# Patient Record
Sex: Female | Born: 1965
Health system: Southern US, Community
[De-identification: ages and names within clinical notes are randomized; demographics above are authoritative.]

## PROBLEM LIST (undated history)

## (undated) DIAGNOSIS — D259 Leiomyoma of uterus, unspecified: Secondary | ICD-10-CM

## (undated) DIAGNOSIS — K601 Chronic anal fissure: Secondary | ICD-10-CM

## (undated) HISTORY — DX: Chronic anal fissure: K60.1

---

## 1992-04-18 HISTORY — PX: TUBAL LIGATION: SHX77

## 1999-03-05 ENCOUNTER — Other Ambulatory Visit: Admission: RE | Admit: 1999-03-05 | Discharge: 1999-03-05 | Payer: Self-pay | Admitting: Obstetrics & Gynecology

## 2000-11-07 ENCOUNTER — Ambulatory Visit (HOSPITAL_COMMUNITY): Admission: RE | Admit: 2000-11-07 | Discharge: 2000-11-07 | Payer: Self-pay | Admitting: General Surgery

## 2000-11-07 ENCOUNTER — Encounter (INDEPENDENT_AMBULATORY_CARE_PROVIDER_SITE_OTHER): Payer: Self-pay

## 2000-11-07 HISTORY — PX: HEMORROIDECTOMY: SUR656

## 2001-02-13 ENCOUNTER — Other Ambulatory Visit: Admission: RE | Admit: 2001-02-13 | Discharge: 2001-02-13 | Payer: Self-pay | Admitting: Obstetrics and Gynecology

## 2001-06-14 ENCOUNTER — Other Ambulatory Visit: Admission: RE | Admit: 2001-06-14 | Discharge: 2001-06-14 | Payer: Self-pay | Admitting: Obstetrics and Gynecology

## 2002-02-14 ENCOUNTER — Other Ambulatory Visit: Admission: RE | Admit: 2002-02-14 | Discharge: 2002-02-14 | Payer: Self-pay | Admitting: Obstetrics and Gynecology

## 2006-11-14 ENCOUNTER — Ambulatory Visit (HOSPITAL_COMMUNITY): Admission: RE | Admit: 2006-11-14 | Discharge: 2006-11-14 | Payer: Self-pay | Admitting: *Deleted

## 2006-11-14 ENCOUNTER — Encounter (INDEPENDENT_AMBULATORY_CARE_PROVIDER_SITE_OTHER): Payer: Self-pay | Admitting: *Deleted

## 2006-11-14 HISTORY — PX: STAPLE HEMORRHOIDECTOMY: SHX2438

## 2009-10-26 ENCOUNTER — Encounter: Admission: RE | Admit: 2009-10-26 | Discharge: 2009-10-26 | Payer: Self-pay | Admitting: Obstetrics and Gynecology

## 2010-04-18 HISTORY — PX: BAND HEMORRHOIDECTOMY: SHX1213

## 2010-05-01 ENCOUNTER — Ambulatory Visit (HOSPITAL_COMMUNITY): Admission: EM | Admit: 2010-05-01 | Discharge: 2010-05-02 | Payer: Self-pay | Source: Home / Self Care

## 2010-05-01 ENCOUNTER — Encounter (INDEPENDENT_AMBULATORY_CARE_PROVIDER_SITE_OTHER): Payer: Self-pay | Admitting: General Surgery

## 2010-05-01 HISTORY — PX: EXCISIONAL HEMORRHOIDECTOMY: SHX1541

## 2010-05-03 LAB — DIFFERENTIAL
Basophils Absolute: 0 10*3/uL (ref 0.0–0.1)
Basophils Relative: 0 % (ref 0–1)
Eosinophils Absolute: 0.1 10*3/uL (ref 0.0–0.7)
Eosinophils Relative: 1 % (ref 0–5)
Lymphocytes Relative: 27 % (ref 12–46)
Lymphs Abs: 1.4 10*3/uL (ref 0.7–4.0)
Monocytes Absolute: 0.4 10*3/uL (ref 0.1–1.0)
Monocytes Relative: 8 % (ref 3–12)
Neutro Abs: 3.4 10*3/uL (ref 1.7–7.7)
Neutrophils Relative %: 64 % (ref 43–77)

## 2010-05-03 LAB — CBC
HCT: 41 % (ref 36.0–46.0)
Hemoglobin: 13.8 g/dL (ref 12.0–15.0)
MCH: 30.8 pg (ref 26.0–34.0)
MCHC: 33.7 g/dL (ref 30.0–36.0)
MCV: 91.5 fL (ref 78.0–100.0)
Platelets: 287 10*3/uL (ref 150–400)
RBC: 4.48 MIL/uL (ref 3.87–5.11)
RDW: 12.2 % (ref 11.5–15.5)
WBC: 5.3 10*3/uL (ref 4.0–10.5)

## 2010-05-03 LAB — BASIC METABOLIC PANEL
BUN: 7 mg/dL (ref 6–23)
CO2: 27 mEq/L (ref 19–32)
Calcium: 9.7 mg/dL (ref 8.4–10.5)
Chloride: 104 mEq/L (ref 96–112)
Creatinine, Ser: 0.82 mg/dL (ref 0.4–1.2)
GFR calc Af Amer: >60 mL/min (ref >60)
GFR calc non Af Amer: >60 mL/min (ref >60)
Glucose, Bld: 88 mg/dL (ref 70–99)
Potassium: 3.9 mEq/L (ref 3.5–5.1)
Sodium: 141 mEq/L (ref 135–145)

## 2010-05-07 NOTE — H&P (Signed)
  NAMESITLALY, GUDIEL                ACCOUNT NO.:  [id number]  MEDICAL RECORD NO.:  [id number]          PATIENT TYPE:  EMS  LOCATION:  ED                           FACILITY:  [facility]  PHYSICIAN:  Lorne Skeens. Hoxworth, M.D.DATE OF BIRTH:  05/05/1965  DATE OF ADMISSION:  05/01/2010 DATE OF DISCHARGE:                             HISTORY & PHYSICAL   CHIEF COMPLAINT:  Severe hemorrhoidal pain.  HISTORY OF PRESENT ILLNESS:  Misty Nunez is a 45 year old female with a long history of hemorrhoidal disease which developed during pregnancy years ago.  She has undergone an open hemorrhoidectomy by Dr. Kendrick Ranch about 10 to 12 years ago.  She had recurrent symptoms and underwent a PPH by Dr. Colin Benton in 2005.  She subsequently developed recurrent symptoms with discomfort, swelling and pressure.  She has undergone two previous banding procedures in recent months at Dr. Jennye Boroughs office without difficulty.  She had a third banding procedure done 3 days ago.  She experienced some increased pain soon after the procedure.  This has steadily worsened and over the last 24 hours has become very severe and intolerable with a large area of external swelling.  She states that the rubber-band and hemorrhoid extruded and she did remove the rubber-band but this did not produce any relief.  She presents to the [facility] Emergency Room.  PAST MEDICAL HISTORY:  She has otherwise been in excellent health without any other previous medical or surgical problems.  MEDICATIONS:  Only Percocet with this illness.  ALLERGIES:  FLAGYL.  SOCIAL HISTORY:  Married.  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  She is married to one of our surgical technicians.  She does not smoke cigarettes or drink alcohol.  FAMILY HISTORY:  Noncontributory.  REVIEW OF SYSTEMS:  GENERAL:  No fever or chills.  RESPIRATORY:  No shortness of breath, cough, or wheezing.  CARDIAC:  No chest pain, palpitations, history of heart disease.   ABDOMEN/GASTROINTESTINAL:  As above with no abdominal pain, nausea, or vomiting.  PHYSICAL EXAMINATION:  VITAL SIGNS:  She is afebrile, heart rate 90, blood pressure 119/71, respirations 16, O2 saturations 100%. GENERAL:  A well-developed, generally healthy-appearing African-American female who is in obvious pain. HEENT:  Sclerae nonicteric.  Oropharynx clear. LUNGS:  Clear without wheezing or increased work of breathing. CARDIAC:  Regular rate and rhythm without murmurs. ABDOMEN:  Soft and nontender. RECTAL:  There is a large, several-centimeter, grade 4, thrombosed and necrotic combination hemorrhoid, exquisitely tender.  ASSESSMENT AND PLAN:  Thrombosed necrotic combination hemorrhoid with severe pain.  The patient will require urgent hemorrhoidectomy under general anesthesia and is admitted for this procedure.     Lorne Skeens. Hoxworth, M.D.     [name]  D:  05/01/2010  T:  05/01/2010  Job:  [id number]  Electronically Signed by Glenna Fellows M.D. on 05/07/2010 09:15:30 AM

## 2010-05-17 NOTE — Discharge Summary (Signed)
  Misty Nunez, Misty Nunez                ACCOUNT NO.:  [id number]  MEDICAL RECORD NO.:  [id number]          PATIENT TYPE:  INP  LOCATION:  1530                         FACILITY:  [facility]  PHYSICIAN:  Sharlet Salina T. Hoxworth, M.D.DATE OF BIRTH:  1965/08/08  DATE OF ADMISSION:  05/01/2010 DATE OF DISCHARGE:  05/02/2010                              DISCHARGE SUMMARY   DISCHARGE DIAGNOSIS:  Thrombosed hemorrhoid.  OPERATIONS AND PROCEDURES:  Hemorrhoidectomy May 01, 2010 by Dr. Gaynelle Adu.  HISTORY OF PRESENT ILLNESS:  The patient is a 45 year old female with a previous history of open hemorrhoidectomy and PTH in years past.  She underwent banding at Dr. Jennye Boroughs office for internal hemorrhoid 3 days ago.  She presents the emergency room with increasing severe pain and swelling.  She has had prolapse of a hemorrhoid.  She removed the rubber- band herself but has not had relief.  PAST MEDICAL HISTORY:  Otherwise negative.  MEDICATIONS:  Percocet.  ALLERGIES:  FLAGYL.  PERTINENT PHYSICAL EXAMINATION:  A well-developed female.  Vital Signs: Within normal limits in obvious pain.  Rectal exam shows a large 3-4 cm grade 4 thrombosed and necrotic combination external hemorrhoid.  HOSPITAL COURSE:  The patient was admitted and underwent emergency hemorrhoidectomy by Dr. Andrey Campanile, which she tolerated well.  She was feeling much better the following day.  She is discharged home at this time.  Discharge medications are same as admission and lidocaine cream. Will follow up with Dr. Andrey Campanile in the office in 1 week.     Lorne Skeens. Hoxworth, M.D.     [name]  D:  05/11/2010  T:  05/11/2010  Job:  [id number]  cc:   Griffith Citron, M.D. Fax: [phone]  Electronically Signed by Glenna Fellows M.D. on 05/17/2010 02:02:00 PM

## 2010-08-31 NOTE — Op Note (Signed)
NAME:  Misty Nunez, Misty Nunez                ACCOUNT NO.:  [id number]   MEDICAL RECORD NO.:  [id number]          PATIENT TYPE:  AMB   LOCATION:  DAY                          FACILITY:  [facility]   PHYSICIAN:  Alfonse Ras, MD   DATE OF BIRTH:  28-Nov-1965   DATE OF PROCEDURE:  11/14/2006  DATE OF DISCHARGE:                               OPERATIVE REPORT   PREOPERATIVE DIAGNOSIS:  Recurrent grade 2 internal hemorrhoids with  bleeding and prolapse.   PROCEDURE:  Procedure for prolapsing hemorrhoids, stapled rectopexy.   SURGEON:  Baruch Merl, M.D.   ANESTHESIA:  General.   DESCRIPTION:  The patient was taken to the operating room placed in a  supine position after adequate general anesthesia was induced using  endotracheal tube.  Perianal and rectal prep were undertaken.  Anal  dilatation was accomplished to three fingerbreadths.  Hemorrhoidal  bundles were injected with 0.25 Marcaine with Wydase.  Internal and  external sphincter muscles were injected with an additional 30 mL of  0.25 Marcaine.  A 2-0 Prolene pursestring suture was placed in the  mucosa and submucosa 5 cm proximal to the dentate line.  The stapler was  then placed proximal to a pursestring and the pursestring was tied down  around the anvil.  This was held in place after it was closed for 45  seconds.  The vagina was checked and there was no evidence of septal  involvement.  Stapler was then fired and removed.  There was a good rim  about 2 cm of hemorrhoidal tissue.  The staple line was then inspected  and there was no evidence of bleeding.  Gelfoam packing was placed.  The  patient tolerated the procedure well went to PACU in good condition.      Alfonse Ras, MD  Electronically Signed     KRE/MEDQ  D:  11/14/2006  T:  11/15/2006  Job:  [id number]

## 2010-09-03 NOTE — Op Note (Signed)
[facility]  Patient:    Misty Nunez, Misty Nunez                         MRN: [id number] Proc. Date: 11/07/00 Attending:  Sheppard Plumber. Earlene Plater, M.D.                           Operative Report  PREOPERATIVE DIAGNOSIS:  Internal and external hemorrhoids.  POSTOPERATIVE DIAGNOSIS:  Internal and external hemorrhoids.  PROCEDURE:  Hemorrhoidectomy and proctoscopy.  SURGEON:  Timothy E. Earlene Plater, M.D.  ANESTHESIA:  General.  INDICATIONS:  [name] is otherwise quite healthy, 35.  She has had hemorrhoids for years, particularly during her pregnancies and multiple deliveries.  She has finally come for surgical consultation.  Rather massive internal and external hemorrhoids are noted, including external anodermal and internal and external complexes.  Her sphincters are intact.  There is no other pathology.  She does have a large uterus with fibroids.  She has been carefully counseled, as well her husband.  DESCRIPTION OF PROCEDURE:  The patient was brought to the operating room and placed supine.  LMA anesthesia provided.  She was placed in lithotomy position, perianal area inspected, prepped and draped in the usual fashion. Proctoscopy carried out to 18 cm and was normal.  The scope withdrawn and area reprepped and draped.  Massive hemorrhoids were noted in the left lateral and right posterior positions.  Additional auxiliary hemorrhoids were present as external and anodermal only in the posterior, left anterior and right anterior positions.  The area was injected around and about with Marcaine 0.5% with epinephrine mixed 9:1 with Wydase.  This was massaged in well.  The anatomy was restored.  Then, with the operating anoscope in place, the two major hemorrhoids were completely excised with a careful skinny ellipse, undermining the edges and closure with 2-0 chromic.  The three external anodermal hemorrhoids were removed as skinny ellipsis as well, undermining the  edges, and then closure with 3-0 chromic.  This completed the procedure.  There was no other pathology.  There was no bleeding or complications.  She tolerated it well.  Counts were correct.  Gelfoam and dry sterile dressing applied.  She was moved to the recovery room in good condition.  Written and verbal instructions including Percocet #36 were given her and her husband, and she will be seen and followed as an outpatient. DD:  11/07/00 TD:  11/07/00 Job: [id number]

## 2010-09-17 ENCOUNTER — Encounter (INDEPENDENT_AMBULATORY_CARE_PROVIDER_SITE_OTHER): Payer: Self-pay | Admitting: General Surgery

## 2010-11-02 ENCOUNTER — Other Ambulatory Visit: Payer: Self-pay | Admitting: Obstetrics and Gynecology

## 2010-11-02 DIAGNOSIS — Z1231 Encounter for screening mammogram for malignant neoplasm of breast: Secondary | ICD-10-CM

## 2010-11-12 ENCOUNTER — Ambulatory Visit
Admission: RE | Admit: 2010-11-12 | Discharge: 2010-11-12 | Disposition: A | Discharge status: Home / Self Care | Payer: 59 | Source: Ambulatory Visit | Attending: Obstetrics and Gynecology | Admitting: Obstetrics and Gynecology

## 2010-11-12 DIAGNOSIS — Z1231 Encounter for screening mammogram for malignant neoplasm of breast: Secondary | ICD-10-CM

## 2011-01-31 LAB — HEMOGLOBIN AND HEMATOCRIT, BLOOD
HCT: 38.6
Hemoglobin: 13.1

## 2011-01-31 LAB — PREGNANCY, URINE: Preg Test, Ur: NEGATIVE

## 2011-02-16 ENCOUNTER — Ambulatory Visit (INDEPENDENT_AMBULATORY_CARE_PROVIDER_SITE_OTHER): Payer: Self-pay | Admitting: General Surgery

## 2011-02-17 ENCOUNTER — Ambulatory Visit (INDEPENDENT_AMBULATORY_CARE_PROVIDER_SITE_OTHER): Payer: Self-pay | Admitting: General Surgery

## 2011-02-22 ENCOUNTER — Other Ambulatory Visit (INDEPENDENT_AMBULATORY_CARE_PROVIDER_SITE_OTHER): Payer: Self-pay | Admitting: General Surgery

## 2011-02-22 ENCOUNTER — Ambulatory Visit (INDEPENDENT_AMBULATORY_CARE_PROVIDER_SITE_OTHER): Payer: 59 | Admitting: General Surgery

## 2011-02-22 ENCOUNTER — Encounter (INDEPENDENT_AMBULATORY_CARE_PROVIDER_SITE_OTHER): Payer: Self-pay | Admitting: General Surgery

## 2011-02-22 VITALS — BP 104/78 | HR 92 | Temp 98.2°F | Resp 12 | Ht 67.0 in | Wt 144.4 lb

## 2011-02-22 DIAGNOSIS — K602 Anal fissure, unspecified: Secondary | ICD-10-CM | POA: Insufficient documentation

## 2011-02-22 NOTE — Patient Instructions (Signed)
Call our office at [phone] when you would have looked at your work schedule.  Please call at least 4 weeks (preferably 6 weeks) before your desired operative week.

## 2011-02-22 NOTE — Progress Notes (Signed)
Chief Complaint  Patient presents with  . Routine Post Op    hemorrhoidectomy     HISTORY:  45 year old female comes in for long-term followup. I last saw her in September 30, 2010. I initially met her in the hospital on January 14 due to an urgent medical problem. She has had long-standing problems with hemorrhoids. Most recently she had undergone hemorrhoidal banding by Dr. Kinnie Scales in early January. This was her third banding. Unfortunately after that banding she developed acute worsening of her rectal pain and developed a necrotic prolapsed hemorrhoid. We took her urgently to the operating room that day and found an anal fissure as well as a grade 4 internal-external necrotic right posterior lateral hemorrhoid. She underwent an open excision of the right posterior lateral mixed column hemorrhoid.  Her hemorrhoids have improved. However her fissure persists. She still has pain that lasts for several hours after having a bowel movement. It is not as bad as it initially was. It is about a 5/10. She describes it as a tenderness. She denies any fevers or chills. She denies any diarrhea or constipation. She denies any incontinence. She denies any abdominal cramping, melena, or hematochezia. She denies any weight loss.  Past Medical History  Diagnosis Date  . Hemorrhoids   . Chronic anal fissure      Past Surgical History  Procedure Date  . Hemorroidectomy 11/07/00    left lateral & Rt posterior  . Tubal ligation 1994  . Staple hemorrhoidectomy 11/14/2006    PPH  . Band hemorrhoidectomy 04/2010    Dr Kinnie Scales x 3  . Excisional hemorrhoidectomy 05/01/10    Rt posterolateral (Grade 4 necrotic)    Current Outpatient Prescriptions  Medication Sig Dispense Refill  . HYDROcodone-acetaminophen (VICODIN ES) 7.5-750 MG per tablet Take 1 tablet by mouth every 6 (six) hours as needed.        Marland Kitchen ibuprofen (ADVIL,MOTRIN) 200 MG tablet Take 200 mg by mouth every 6 (six) hours as needed.        . nitroGLYCERIN  (NITROGLYN) 2 % ointment Place 0.5 inches onto the skin 3 (three) times daily.        Marland Kitchen oxyCODONE-acetaminophen (PERCOCET) 5-325 MG per tablet Take 1 tablet by mouth every 4 (four) hours as needed.        . vitamin B-12 (CYANOCOBALAMIN) 1000 MCG tablet Take 1,000 mcg by mouth daily.           Allergies  Allergen Reactions  . Flagyl (Metronidazole) Hives     Family History  Problem Relation Age of Onset  . Stroke Father      History   Social History  . Marital Status: Married    Spouse Name: N/A    Number of Children: N/A  . Years of Education: N/A   Social History Main Topics  . Smoking status: Never Smoker   . Smokeless tobacco: None  . Alcohol Use: Yes  . Drug Use: No  . Sexually Active:    Other Topics Concern  . None   Social History Narrative  . None     REVIEW OF SYSTEMS - PERTINENT POSITIVES ONLY: A mild cough without any sputum production. Otherwise a comprehensive review of systems is negative except for what is mentioned in the history of present illness   EXAM: Filed Vitals:   02/22/11 1452  BP: 104/78  Pulse: 92  Temp: 98.2 F (36.8 C)  Resp: 12    HEENT: normocephalic; pupils equal and reactive; sclerae clear; dentition  good; mucous membranes moist NECK:  ; symmetric on extension; no palpable anterior or posterior cervical lymphadenopathy; no supraclavicular masses; no tenderness CHEST: clear to auscultation bilaterally without rales, rhonchi, or wheezes CARDIAC: regular rate and rhythm without significant murmur; peripheral pulses are full ABDOMEN: Soft, NT, ND Rectal:  Small nonthrombosed, redundant ext hemorrhoid tissue in L lat position; chronic sentinel pile with fissure in post midline; DRE & anoscopy deferred secondary to discomfort EXT:  non-tender without edema; no deformity NEURO: no gross focal deficits; no sign of tremor PSYCH: Alert, nad, appropriate SKIN:  No rash   LABORATORY RESULTS: See Epic for most recent  results   RADIOLOGY RESULTS: See Epic or I-Site for most recent results   IMPRESSION: 45 yo AAF with chronic anal fissure   PLAN: EUA, lateral anal sphincterotomy  We re- discussed the etiologies and management of anal fissures. We discussed nonoperative and operative management. We also discussed the use of Botox.  She essentially has had an anal fissure since January 2012. Despite nonsurgical management her fissure persists. We discussed ongoing nonoperative management as well as surgery. The patient has elected to proceed with surgery.  We discussed the risk and benefits as well as the aftercare with a lateral anal sphincterotomy. We discussed specifically the risk of bleeding, infection, urinary retention, blood clot formation, general anesthesia risk, as well as the risk of incontinence. We discussed that there is a 20-25% risk of incontinence to flatus, 10-20% risk of incontinence to liquid stool, and a 5-10% risk of incontinence to solid stool with surgery. We discussed the importance of not becoming constipated after surgery. We also discussed the typical aftercare.  The patient states that she will call our office after she has reviewed her schedule to schedule surgery.   Mary Sella. Andrey Campanile, MD, FACS General, Bariatric, & Minimally Invasive Surgery [facility]      Visit Diagnoses: 1. Anal fissure     Primary Care Physician: Serita Kyle, MD, MD

## 2011-03-15 ENCOUNTER — Encounter (HOSPITAL_COMMUNITY): Payer: Self-pay | Admitting: Pharmacy Technician

## 2011-03-18 ENCOUNTER — Encounter (HOSPITAL_COMMUNITY)
Admission: RE | Admit: 2011-03-18 | Discharge: 2011-03-18 | Disposition: A | Discharge status: Home / Self Care | Payer: 59 | Source: Ambulatory Visit | Attending: General Surgery | Admitting: General Surgery

## 2011-03-18 ENCOUNTER — Encounter (HOSPITAL_COMMUNITY): Payer: Self-pay

## 2011-03-18 LAB — SURGICAL PCR SCREEN
MRSA, PCR: NEGATIVE
Staphylococcus aureus: NEGATIVE

## 2011-03-18 LAB — CBC
MCH: 30.9 pg (ref 26.0–34.0)
MCHC: 33 g/dL (ref 30.0–36.0)
MCV: 93.5 fL (ref 78.0–100.0)
Platelets: 261 10*3/uL (ref 150–400)
RBC: 4.34 MIL/uL (ref 3.87–5.11)
RDW: 12.7 % (ref 11.5–15.5)

## 2011-03-18 NOTE — Pre-Procedure Instructions (Signed)
20 ESME FREUND  03/18/2011   Your procedure is scheduled on:  DEC 11 Report to [facility] at 0530AM.  Call this number if you have problems the morning of surgery: [phone]   Remember:   Do not eat food:After Midnight.  May have clear liquids: up to 4 Hours before arrival.  Clear liquids include soda, tea, black coffee, apple or grape juice, broth.  Take these medicines the morning of surgery with A SIP OF WATER:NONE   Do not wear jewelry, make-up or nail polish.  Do not wear lotions, powders, or perfumes. You may wear deodorant.  Do not shave 48 hours prior to surgery.  Do not bring valuables to the hospital.  Contacts, dentures or bridgework may not be worn into surgery.  Leave suitcase in the car. After surgery it may be brought to your room.  For patients admitted to the hospital, checkout time is 11:00 AM the day of discharge.   Patients discharged the day of surgery will not be allowed to drive home.  Name and phone number of your driver: FAMILY  Special Instructions: CHG Shower Use Special Wash: 1/2 bottle night before surgery and 1/2 bottle morning of surgery.   Please read over the following fact sheets that you were given: MRSA Information and Surgical Site Infection Prevention

## 2011-03-28 MED ORDER — DEXTROSE 5 % IV SOLN
1.0000 g | INTRAVENOUS | Status: DC
  Filled 2011-03-28: qty 1

## 2011-03-29 ENCOUNTER — Ambulatory Visit (HOSPITAL_COMMUNITY): Payer: 59 | Admitting: Anesthesiology

## 2011-03-29 ENCOUNTER — Ambulatory Visit (HOSPITAL_COMMUNITY)
Admission: RE | Admit: 2011-03-29 | Discharge: 2011-03-29 | Disposition: A | Discharge status: Home / Self Care | Payer: 59 | Source: Ambulatory Visit | Attending: General Surgery | Admitting: General Surgery

## 2011-03-29 ENCOUNTER — Encounter (HOSPITAL_COMMUNITY): Payer: Self-pay | Admitting: *Deleted

## 2011-03-29 ENCOUNTER — Encounter (HOSPITAL_COMMUNITY)
Admission: RE | Disposition: A | Discharge status: Home / Self Care | Payer: Self-pay | Source: Ambulatory Visit | Attending: General Surgery

## 2011-03-29 ENCOUNTER — Encounter (HOSPITAL_COMMUNITY): Payer: Self-pay | Admitting: Anesthesiology

## 2011-03-29 DIAGNOSIS — K602 Anal fissure, unspecified: Secondary | ICD-10-CM

## 2011-03-29 DIAGNOSIS — K648 Other hemorrhoids: Secondary | ICD-10-CM | POA: Insufficient documentation

## 2011-03-29 DIAGNOSIS — Z01812 Encounter for preprocedural laboratory examination: Secondary | ICD-10-CM | POA: Insufficient documentation

## 2011-03-29 HISTORY — PX: SPHINCTEROTOMY: SHX5279

## 2011-03-29 HISTORY — PX: EXAMINATION UNDER ANESTHESIA: SHX1540

## 2011-03-29 SURGERY — EXAM UNDER ANESTHESIA
Anesthesia: General | Wound class: Contaminated

## 2011-03-29 MED ORDER — PROPOFOL 10 MG/ML IV EMUL
INTRAVENOUS | Status: DC | PRN
  Administered 2011-03-29: 150 mg via INTRAVENOUS

## 2011-03-29 MED ORDER — ONDANSETRON HCL 4 MG/2ML IJ SOLN
INTRAMUSCULAR | Status: DC | PRN
  Administered 2011-03-29: 4 mg via INTRAVENOUS

## 2011-03-29 MED ORDER — MEPERIDINE HCL 25 MG/ML IJ SOLN: 6.2500 mg | INTRAMUSCULAR | Status: DC | PRN

## 2011-03-29 MED ORDER — GLYCOPYRROLATE 0.2 MG/ML IJ SOLN
INTRAMUSCULAR | Status: DC | PRN
  Administered 2011-03-29: .4 mg via INTRAVENOUS

## 2011-03-29 MED ORDER — DOCUSATE SODIUM 100 MG PO CAPS: 100.0000 mg | ORAL_CAPSULE | Freq: Two times a day (BID) | ORAL | Status: AC

## 2011-03-29 MED ORDER — LACTATED RINGERS IV SOLN
INTRAVENOUS | Status: DC | PRN
  Administered 2011-03-29: 07:00:00 via INTRAVENOUS

## 2011-03-29 MED ORDER — ROCURONIUM BROMIDE 100 MG/10ML IV SOLN
INTRAVENOUS | Status: DC | PRN
  Administered 2011-03-29: 30 mg via INTRAVENOUS

## 2011-03-29 MED ORDER — DEXAMETHASONE SODIUM PHOSPHATE 10 MG/ML IJ SOLN
INTRAMUSCULAR | Status: DC | PRN
  Administered 2011-03-29: 8 mg via INTRAVENOUS

## 2011-03-29 MED ORDER — DIBUCAINE 1 % RE OINT
TOPICAL_OINTMENT | RECTAL | Status: DC | PRN
  Administered 2011-03-29: 1 via RECTAL

## 2011-03-29 MED ORDER — MIDAZOLAM HCL 5 MG/5ML IJ SOLN
INTRAMUSCULAR | Status: DC | PRN
  Administered 2011-03-29: 1 mg via INTRAVENOUS

## 2011-03-29 MED ORDER — BUPIVACAINE LIPOSOME 1.3 % IJ SUSP
20.0000 mL | Freq: Once | INTRAMUSCULAR | Status: DC
  Filled 2011-03-29: qty 20

## 2011-03-29 MED ORDER — SODIUM CHLORIDE 0.9 % IR SOLN
Status: DC | PRN
  Administered 2011-03-29: 1000 mL

## 2011-03-29 MED ORDER — FENTANYL CITRATE 0.05 MG/ML IJ SOLN
INTRAMUSCULAR | Status: DC | PRN
  Administered 2011-03-29: 100 ug via INTRAVENOUS

## 2011-03-29 MED ORDER — OXYCODONE-ACETAMINOPHEN 5-325 MG PO TABS: 1.0000 | ORAL_TABLET | ORAL | Status: AC | PRN

## 2011-03-29 MED ORDER — PROMETHAZINE HCL 25 MG/ML IJ SOLN: 6.2500 mg | INTRAMUSCULAR | Status: DC | PRN

## 2011-03-29 MED ORDER — BUPIVACAINE LIPOSOME 1.3 % IJ SUSP
INTRAMUSCULAR | Status: DC | PRN
  Administered 2011-03-29: 20 mL

## 2011-03-29 MED ORDER — NEOSTIGMINE METHYLSULFATE 1 MG/ML IJ SOLN
INTRAMUSCULAR | Status: DC | PRN
  Administered 2011-03-29: 3 mg via INTRAVENOUS

## 2011-03-29 MED ORDER — DEXTROSE 5 % IV SOLN
1.0000 g | INTRAVENOUS | Status: DC | PRN
  Administered 2011-03-29: 1 g via INTRAVENOUS

## 2011-03-29 MED ORDER — BUPIVACAINE-EPINEPHRINE 0.25% -1:200000 IJ SOLN
INTRAMUSCULAR | Status: DC | PRN
  Administered 2011-03-29: 1 mL

## 2011-03-29 MED ORDER — HYDROMORPHONE HCL PF 1 MG/ML IJ SOLN: 0.2500 mg | INTRAMUSCULAR | Status: DC | PRN

## 2011-03-29 SURGICAL SUPPLY — 45 items
BENZOIN TINCTURE PRP APPL 2/3 (GAUZE/BANDAGES/DRESSINGS) ×2 IMPLANT
BLADE SURG 15 STRL LF DISP TIS (BLADE) ×1 IMPLANT
BLADE SURG 15 STRL SS (BLADE) ×1
CANISTER SUCTION 2500CC (MISCELLANEOUS) ×2 IMPLANT
CLOTH BEACON ORANGE TIMEOUT ST (SAFETY) ×2 IMPLANT
COVER SURGICAL LIGHT HANDLE (MISCELLANEOUS) ×2 IMPLANT
DECANTER SPIKE VIAL GLASS SM (MISCELLANEOUS) ×2 IMPLANT
DRAPE ORTHO SPLIT 77X108 STRL (DRAPES) ×1
DRAPE SURG ORHT 6 SPLT 77X108 (DRAPES) ×1 IMPLANT
DRAPE UTILITY 15X26 W/TAPE STR (DRAPE) ×4 IMPLANT
DRSG PAD ABDOMINAL 8X10 ST (GAUZE/BANDAGES/DRESSINGS) ×2 IMPLANT
ELECT CAUTERY BLADE 6.4 (BLADE) ×2 IMPLANT
ELECT REM PT RETURN 9FT ADLT (ELECTROSURGICAL) ×2
ELECTRODE REM PT RTRN 9FT ADLT (ELECTROSURGICAL) ×1 IMPLANT
GAUZE SPONGE 4X4 12PLY STRL LF (GAUZE/BANDAGES/DRESSINGS) ×2 IMPLANT
GAUZE SPONGE 4X4 16PLY XRAY LF (GAUZE/BANDAGES/DRESSINGS) ×2 IMPLANT
GLOVE BIOGEL PI IND STRL 7.5 (GLOVE) ×1 IMPLANT
GLOVE BIOGEL PI IND STRL 8 (GLOVE) ×1 IMPLANT
GLOVE BIOGEL PI INDICATOR 7.5 (GLOVE) ×1
GLOVE BIOGEL PI INDICATOR 8 (GLOVE) ×1
GLOVE ECLIPSE 7.5 STRL STRAW (GLOVE) ×4 IMPLANT
GOWN BRE IMP SLV AUR XL STRL (GOWN DISPOSABLE) ×2 IMPLANT
GOWN STRL NON-REIN LRG LVL3 (GOWN DISPOSABLE) ×2 IMPLANT
GOWN STRL REIN XL XLG (GOWN DISPOSABLE) ×2 IMPLANT
KIT BASIN OR (CUSTOM PROCEDURE TRAY) ×2 IMPLANT
KIT ROOM TURNOVER OR (KITS) ×2 IMPLANT
NEEDLE HYPO 25X1 1.5 SAFETY (NEEDLE) ×2 IMPLANT
NS IRRIG 1000ML POUR BTL (IV SOLUTION) ×2 IMPLANT
PACK LITHOTOMY IV (CUSTOM PROCEDURE TRAY) IMPLANT
PACK SURGICAL SETUP 50X90 (CUSTOM PROCEDURE TRAY) ×2 IMPLANT
PAD ARMBOARD 7.5X6 YLW CONV (MISCELLANEOUS) ×4 IMPLANT
PENCIL BUTTON HOLSTER BLD 10FT (ELECTRODE) ×2 IMPLANT
SPONGE GAUZE 4X4 12PLY (GAUZE/BANDAGES/DRESSINGS) ×2 IMPLANT
SPONGE SURGIFOAM ABS GEL 12-7 (HEMOSTASIS) ×2 IMPLANT
SURGILUBE 2OZ TUBE FLIPTOP (MISCELLANEOUS) ×2 IMPLANT
SUT CHROMIC 2 0 SH (SUTURE) IMPLANT
SUT CHROMIC 3 0 SH 27 (SUTURE) IMPLANT
SYR CONTROL 10ML LL (SYRINGE) ×2 IMPLANT
TOWEL OR 17X24 6PK STRL BLUE (TOWEL DISPOSABLE) ×2 IMPLANT
TOWEL OR 17X26 10 PK STRL BLUE (TOWEL DISPOSABLE) ×2 IMPLANT
TOWEL OR NON WOVEN STRL DISP B (DISPOSABLE) ×2 IMPLANT
TRAY PROCTOSCOPIC FIBER OPTIC (SET/KITS/TRAYS/PACK) IMPLANT
TUBE CONNECTING 12X1/4 (SUCTIONS) ×2 IMPLANT
WATER STERILE IRR 1000ML POUR (IV SOLUTION) IMPLANT
YANKAUER SUCT BULB TIP NO VENT (SUCTIONS) ×2 IMPLANT

## 2011-03-29 NOTE — H&P (Signed)
Chief Complaint   Patient presents with   .  Routine Post Op     hemorrhoidectomy     HISTORY:  45 year old female comes in for long-term followup. I last saw her in September 30, 2010. I initially met her in the hospital on January 14 due to an urgent medical problem. She has had long-standing problems with hemorrhoids. Most recently she had undergone hemorrhoidal banding by Dr. Kinnie Scales in early January. This was her third banding. Unfortunately after that banding she developed acute worsening of her rectal pain and developed a necrotic prolapsed hemorrhoid. We took her urgently to the operating room that day and found an anal fissure as well as a grade 4 internal-external necrotic right posterior lateral hemorrhoid. She underwent an open excision of the right posterior lateral mixed column hemorrhoid.  Her hemorrhoids have improved. However her fissure persists. She still has pain that lasts for several hours after having a bowel movement. It is not as bad as it initially was. It is about a 5/10. She describes it as a tenderness. She denies any fevers or chills. She denies any diarrhea or constipation. She denies any incontinence. She denies any abdominal cramping, melena, or hematochezia. She denies any weight loss.   Past Medical History   Diagnosis  Date   .  Hemorrhoids    .  Chronic anal fissure     Past Surgical History   Procedure  Date   .  Hemorroidectomy  11/07/00     left lateral & Rt posterior   .  Tubal ligation  1994   .  Staple hemorrhoidectomy  11/14/2006     PPH   .  Band hemorrhoidectomy  04/2010     Dr Kinnie Scales x 3   .  Excisional hemorrhoidectomy  05/01/10     Rt posterolateral (Grade 4 necrotic)    Current Outpatient Prescriptions   Medication  Sig  Dispense  Refill   .  HYDROcodone-acetaminophen (VICODIN ES) 7.5-750 MG per tablet  Take 1 tablet by mouth every 6 (six) hours as needed.     Marland Kitchen  ibuprofen (ADVIL,MOTRIN) 200 MG tablet  Take 200 mg by mouth every 6 (six) hours as  needed.     .  nitroGLYCERIN (NITROGLYN) 2 % ointment  Place 0.5 inches onto the skin 3 (three) times daily.     Marland Kitchen  oxyCODONE-acetaminophen (PERCOCET) 5-325 MG per tablet  Take 1 tablet by mouth every 4 (four) hours as needed.     .  vitamin B-12 (CYANOCOBALAMIN) 1000 MCG tablet  Take 1,000 mcg by mouth daily.      Allergies   Allergen  Reactions   .  Flagyl (Metronidazole)  Hives    Family History   Problem  Relation  Age of Onset   .  Stroke  Father     History    Social History   .  Marital Status:  Married     Spouse Name:  N/A     Number of Children:  N/A   .  Years of Education:  N/A    Social History Main Topics   .  Smoking status:  Never Smoker   .  Smokeless tobacco:  None   .  Alcohol Use:  Yes   .  Drug Use:  No   .  Sexually Active:     Other Topics  Concern   .  None    Social History Narrative   .  None  REVIEW OF SYSTEMS - PERTINENT POSITIVES ONLY:  A mild cough without any sputum production. Otherwise a comprehensive review of systems is negative except for what is mentioned in the history of present illness. Cough has resolved  EXAM:  Filed Vitals:    BP 106/71  Pulse 77  Temp(Src) 98.4 F (36.9 C) (Oral)  Resp 20  SpO2 100%   BP:    Pulse:    Temp:    Resp:    HEENT: normocephalic; pupils equal and reactive; sclerae clear; dentition good; mucous membranes moist  NECK: ; symmetric on extension; no palpable anterior or posterior cervical lymphadenopathy; no supraclavicular masses; no tenderness  CHEST: clear to auscultation bilaterally without rales, rhonchi, or wheezes  CARDIAC: regular rate and rhythm without significant murmur; peripheral pulses are full  ABDOMEN: Soft, NT, ND  Rectal: deferred today EXT: non-tender without edema; no deformity  NEURO: no gross focal deficits; no sign of tremor  PSYCH: Alert, nad, appropriate  SKIN: No rash   LABORATORY RESULTS:  See Epic for most recent results   RADIOLOGY RESULTS:  See Epic or  I-Site for most recent results   IMPRESSION:  45 yo AAF with chronic anal fissure   PLAN:  EUA, lateral anal sphincterotomy  We re- discussed the etiologies and management of anal fissures. We discussed nonoperative and operative management. We also discussed the use of Botox.  She essentially has had an anal fissure since January 2012. Despite nonsurgical management her fissure persists. We discussed ongoing nonoperative management as well as surgery. The patient has elected to proceed with surgery.  We discussed the risk and benefits as well as the aftercare with a lateral anal sphincterotomy. We discussed specifically the risk of bleeding, infection, urinary retention, blood clot formation, general anesthesia risk, as well as the risk of incontinence. We discussed that there is a 20-25% risk of incontinence to flatus, 10-20% risk of incontinence to liquid stool, and a 5-10% risk of incontinence to solid stool with surgery. We discussed the importance of not becoming constipated after surgery. We also discussed the typical aftercare.    Mary Sella. Andrey Campanile, MD, FACS  General, Bariatric, & Minimally Invasive Surgery  [facility]  Visit Diagnoses:  1.  Anal fissure   Primary Care Physician:  Serita Kyle, MD, MD

## 2011-03-29 NOTE — Transfer of Care (Signed)
Immediate Anesthesia Transfer of Care Note  Patient: Misty Nunez  Procedure(s) Performed:  EXAM UNDER ANESTHESIA; SPHINCTEROTOMY - lateral anal sphincterotomy  Patient Location: PACU  Anesthesia Type: General  Level of Consciousness: awake, alert  and oriented  Airway & Oxygen Therapy: Patient Spontanous Breathing  Post-op Assessment: Report given to PACU RN and Post -op Vital signs reviewed and stable  Post vital signs: Reviewed and stable  Complications: No apparent anesthesia complications

## 2011-03-29 NOTE — Progress Notes (Signed)
All checklist information including NPO status reviewed with patient in holding area.

## 2011-03-29 NOTE — Anesthesia Postprocedure Evaluation (Signed)
  Anesthesia Post-op Note  Patient: Misty Nunez  Procedure(s) Performed:  EXAM UNDER ANESTHESIA; SPHINCTEROTOMY - lateral anal sphincterotomy  Patient Location: PACU  Anesthesia Type: General  Level of Consciousness: sedated  Airway and Oxygen Therapy: Patient Spontanous Breathing  Post-op Pain: none  Post-op Assessment: Post-op Vital signs reviewed, Patient's Cardiovascular Status Stable, Respiratory Function Stable, Patent Airway and No signs of Nausea or vomiting  Post-op Vital Signs: Reviewed and stable  Complications: No apparent anesthesia complications

## 2011-03-29 NOTE — Anesthesia Preprocedure Evaluation (Addendum)
Anesthesia Evaluation  Patient identified by MRN, date of birth, ID band Patient awake    Reviewed: Allergy & Precautions, H&P , NPO status , Patient's Chart, lab work & pertinent test results  Airway Mallampati: I TM Distance: >3 FB Neck ROM: full    Dental  (+) Teeth Intact and Dental Advidsory Given   Pulmonary neg pulmonary ROS,    Pulmonary exam normal       Cardiovascular neg cardio ROS - Valvular Problems/Murmursregular Normal    Neuro/Psych Negative Neurological ROS  Negative Psych ROS   GI/Hepatic negative GI ROS, Neg liver ROS,   Endo/Other  Negative Endocrine ROS  Renal/GU negative Renal ROS  Genitourinary negative   Musculoskeletal negative musculoskeletal ROS (+)   Abdominal Normal abdominal exam  (+)   Peds negative pediatric ROS (+)  Hematology negative hematology ROS (+)   Anesthesia Other Findings   Reproductive/Obstetrics negative OB ROS                          Anesthesia Physical Anesthesia Plan  ASA: I  Anesthesia Plan: General ETT   Post-op Pain Management:    Induction: Intravenous  Airway Management Planned: Oral ETT  Additional Equipment:   Intra-op Plan:   Post-operative Plan:   Informed Consent: I have reviewed the patients History and Physical, chart, labs and discussed the procedure including the risks, benefits and alternatives for the proposed anesthesia with the patient or authorized representative who has indicated his/her understanding and acceptance.   Dental Advisory Given  Plan Discussed with: Anesthesiologist  Anesthesia Plan Comments:        Anesthesia Quick Evaluation

## 2011-03-29 NOTE — Op Note (Signed)
Misty Nunez, Misty Nunez                ACCOUNT NO.:  [id number]  MEDICAL RECORD NO.:  [id number]  LOCATION:  [facility]                         FACILITY:  [facility]  PHYSICIAN:  Mary Sella. Andrey Campanile, MD     DATE OF BIRTH:  1965/12/15  DATE OF PROCEDURE:  03/29/2011 DATE OF DISCHARGE:                              OPERATIVE REPORT   PREOPERATIVE DIAGNOSIS:  Anal fissure.  POSTOP DIAGNOSES: 1. Chronic anal fissure. 2. Right anterior grade 1 internal hemorrhoids.  PROCEDURE: 1. Exam under anesthesia. 2. Open lateral internal sphincterotomy.  SURGEON:  Mary Sella. Andrey Campanile, MD  ANESTHESIA:  General plus 20 mL of Exparel.  SPECIMEN:  None.  ESTIMATED BLOOD LOSS:  Minimal.  FINDINGS:  The patient had a grade 1 right anterior internal hemorrhoid. She had non-thrombosed redundant left anterior external hemorrhoidal tissue.  She had a small sentinel pile in the posterior midline.  She also had fissure in the posterior midline as well.  The sphincter was hypertonic.  INDICATIONS FOR PROCEDURE:  The patient is a very pleasant 45 year old [place] American female who I initially met about a year ago.  She has had a longstanding problem with hemorrhoids.  Then, she had developed a necrotic prolapsed hemorrhoid that required urgent hemorrhoidectomy.  At that time of surgery, she was found to have an anal fissure.  Because of the necrotic prolapsed hemorrhoid, we just simply did an open excision of the right posterolateral mixed column hemorrhoid, and let her heal from that.  We have been medically managing her anal fissure since January 2012, however, she continues to have pain that lasts for several hours after having a bowel movement.  It is improved somewhat with medical treatment with nifedipine ointment as well as giving the patient regular, however, she still has lifestyle-limiting pain.  We discussed the use of alternative treatments such as the use of Botox versus surgical treatment of anal fissures  including a sphincterotomy.  We discussed the risks and benefits including but not limited to bleeding, infection, injury to surrounding structures, urinary retention, blood clot formation, incontinence to gas, incontinence of solid stool, incontinence to liquid stool.  We have also discussed the typical postoperative course.  She elects to proceed to surgery.  DESCRIPTION OF PROCEDURE:  After obtaining informed consent, the patient was brought to the operating room.  General endotracheal anesthesia was established.  She was then placed in the prone jackknife position with the appropriate padding.  Sequential compression devices had been placed.  She received antibiotics prior to skin incision.  Her buttocks were taped apart and her perineum was prepped with Betadine.  A surgical time-out was performed.  I confirmed the presence of a fissure by one, there was evidence of a sentinel pile in the posterior midline and there was also a tear in the posterior midline.  On insertion of the anoscope, there was resistance confirming the presence of hypertonic sphincter, and I was able to visualize a taut internal sphincter.  I elected to do the sphincterotomy on the right side.  First, I injected 1 mL of 0.25% epi over the skin where I was going to make my incision and then an incision was made at  the intersphincteric groove and extended 1.5 cm distally to the perianal skin.  I then used a fine curved hemostat to mobilize the anoderm off the internal sphincter up to the level of dentate line.  The anoderm was not violated.  The intersphincteric plane was assessed and the internal sphincter was isolated using the hemostat up to the dentate line.  I did use electrocautery to control the small areas of bleeding.  I then used a 15 blade to divide 1/3 of the internal sphincter.  I then used my index finger to press down on the sphincter after I removed the hemostat.  On reinsertion of the retractor,  there was no resistance.  I elected to leave the sentinel tag alone, it is very small.  I then injected Exparel in a regional fashion.  A small piece of Gelfoam was placed in the rectum and then dibucaine ointment was placed around the perineum.  We then placed 4x4s, ABDs, and mesh underwear.  The patient was then placed in the supine position, extubated and brought to the recovery room.  All needle, instrument, and sponge counts were correct.  There were no immediate complications.  The patient the procedure well.     Mary Sella. Andrey Campanile, MD, FACS     EMW/MEDQ  D:  03/29/2011  T:  03/29/2011  Job:  [id number]  cc:   Maxie Better, M.D. Griffith Citron, M.D.

## 2011-03-29 NOTE — Brief Op Note (Signed)
03/29/2011  8:42 AM  PATIENT:  Marcos Eke Qadir  45 y.o. female  PRE-OPERATIVE DIAGNOSIS:  Anal fissure [565.0]  POST-OPERATIVE DIAGNOSIS: Chronic Anal fissure Right Grade 1 internal hemorrhoid  PROCEDURE:  Procedure(s): EXAM UNDER ANESTHESIA OPEN LATERAL INTERNAL SPHINCTEROTOMY  SURGEON:  Surgeon(s): Atilano Ina, MD  PHYSICIAN ASSISTANT: NONE  ASSISTANTS: none   ANESTHESIA:   local and general  EBL:  Total I/O In: 450 [I.V.:450] Out: 10 [Blood:10]  BLOOD ADMINISTERED:none  DRAINS: none   LOCAL MEDICATIONS USED:  OTHER 20CC EXPAREL  SPECIMEN:  No Specimen  DISPOSITION OF SPECIMEN:  N/A  COUNTS:  YES  TOURNIQUET:  * No tourniquets in log *  DICTATION: .Other Dictation: Dictation Number [id number]  PLAN OF CARE: Discharge to home after PACU  PATIENT DISPOSITION:  PACU - hemodynamically stable.   Delay start of Pharmacological VTE agent (>24hrs) due to surgical blood loss or risk of bleeding:  {YES/NO/NOT APPLICABLE:20182

## 2011-03-29 NOTE — Preoperative (Signed)
Beta Blockers   Reason not to administer Beta Blockers:Not Applicable 

## 2011-03-31 ENCOUNTER — Encounter (HOSPITAL_COMMUNITY): Payer: Self-pay | Admitting: General Surgery

## 2011-04-15 ENCOUNTER — Other Ambulatory Visit (INDEPENDENT_AMBULATORY_CARE_PROVIDER_SITE_OTHER): Payer: Self-pay | Admitting: General Surgery

## 2011-04-15 NOTE — Telephone Encounter (Signed)
Refill for colace 100mg  capsules oked by Dr Andrey Campanile and called [facility] [phone].

## 2011-04-22 ENCOUNTER — Encounter (INDEPENDENT_AMBULATORY_CARE_PROVIDER_SITE_OTHER): Payer: Self-pay | Admitting: General Surgery

## 2011-04-22 ENCOUNTER — Ambulatory Visit (INDEPENDENT_AMBULATORY_CARE_PROVIDER_SITE_OTHER): Payer: 59 | Admitting: General Surgery

## 2011-04-22 VITALS — BP 128/78 | HR 68 | Resp 12 | Ht 69.0 in | Wt 146.0 lb

## 2011-04-22 DIAGNOSIS — Z09 Encounter for follow-up examination after completed treatment for conditions other than malignant neoplasm: Secondary | ICD-10-CM

## 2011-04-22 NOTE — Patient Instructions (Signed)

## 2011-04-22 NOTE — Progress Notes (Signed)
Chief complaint: Postop  Procedure: Status post exam under anesthesia, open lateral anal sphincterotomy December 11  History of Present Ilness: 46 year old [place] American female comes in for her first postoperative appointment. She states the first few days were fine. However that Saturday she was finally able to have a bowel movement and she had lots of pain with her bowel movements. She states that she still straining. However she is straining about half the amount she used to before surgery. He she denies current dysuria. She denies any incontinence. She denies any incontinence to flatus, incontinence to liquid stool, or incontinence the solid stool. She is still having some intermittent bleeding. She states that she has been drinking lots of water, taking stool softeners, and eating a high-fiber diet. She is still having trouble finding Benefiber   Physical Exam: BP 128/78  Pulse 68  Resp 12  Ht 5\' 9"  (1.753 m)  Wt 146 lb (66.225 kg)  BMI 21.56 kg/m2  Gen: alert, NAD, non-toxic appearing Pulm: Lungs clear to auscultation, symmetric chest rise CV: regular rate and rhythm Abd: soft, nontender, nondistended. Well-healed trocar sites. No cellulitis. No incisional hernia Rectal: no cellulitis, fluctuance, induration. Some redundant non-thrombosed ext hemorrhoidal tissue in post midline. Lateral incision almost healed. DRE deferred today    Assessment and Plan: Status post exam under anesthesia, open lateral anal sphincterotomy  I think she's doing fairly well. I think we need to give her some more time to heal. There is no sign of infection. There is no incontinence. I'll see her in 2 months. She was encouraged to continue with her high fiber diet.  Mary Sella. Andrey Campanile, MD, FACS General, Bariatric, & Minimally Invasive Surgery [facility], [place]

## 2011-05-17 ENCOUNTER — Other Ambulatory Visit (INDEPENDENT_AMBULATORY_CARE_PROVIDER_SITE_OTHER): Payer: Self-pay | Admitting: General Surgery

## 2011-06-10 ENCOUNTER — Ambulatory Visit (INDEPENDENT_AMBULATORY_CARE_PROVIDER_SITE_OTHER): Payer: 59 | Admitting: General Surgery

## 2011-06-10 ENCOUNTER — Encounter (INDEPENDENT_AMBULATORY_CARE_PROVIDER_SITE_OTHER): Payer: Self-pay | Admitting: General Surgery

## 2011-06-10 VITALS — BP 108/78 | HR 95 | Temp 97.9°F | Ht 69.0 in | Wt 145.8 lb

## 2011-06-10 DIAGNOSIS — Z09 Encounter for follow-up examination after completed treatment for conditions other than malignant neoplasm: Secondary | ICD-10-CM

## 2011-06-10 NOTE — Patient Instructions (Signed)
Keep drinking 6-8 glasses/day Continue to eat a high fiber diet Continue with the stool softner

## 2011-06-10 NOTE — Progress Notes (Signed)
Chief complaint: Followup   Procedure: Status post exam under anesthesia, open lateral anal sphincterotomy December 11  History of Present Ilness: 46 year old [place] American female comes in for her second postoperative visit. I last saw her on January 4. She states that she's overall been doing pretty good. She denies any incontinence. She reports daily bowel movements. She is straining on occasion. She is taking stool softeners and Citrucel on a daily basis. She will have pain with defecation on average about twice a week. It generally occurs if she does a day without having a bowel movement. She reports rare bleeding.  Physical Exam: /BP 108/78  Pulse 95  Temp(Src) 97.9 F (36.6 C) (Temporal)  Ht 5\' 9"  (1.753 m)  Wt 145 lb 12.8 oz (66.134 kg)  BMI 21.53 kg/m2  SpO2 100%  Well-developed well-nourished American female no apparent distress Rectal-no thrombosed external hemorrhoids, well-healed lateral incision. DRE reveals good tone. Limited anoscopy in the posterior midline shows a healed anal fissure and some scarring  Assessment and Plan: Status post exam under anesthesia, an open lateral internal sphincterotomy for chronic anal fissure  Overall she is improved. She is still having some intermittent problems with pain with defecation. It appears mainly to occur when she goes a day without having a bowel movement. I encouraged her to keep compliant with her water and fiber intake. I'm not sure if this will ever completely resolve. We did briefly mention Botox injections. However I would like to wait a few more months to see how this pans out.  Followup in 4 months  Mary Sella. Andrey Campanile, MD, FACS General, Bariatric, & Minimally Invasive Surgery [facility], [place]

## 2011-06-12 ENCOUNTER — Other Ambulatory Visit (INDEPENDENT_AMBULATORY_CARE_PROVIDER_SITE_OTHER): Payer: Self-pay | Admitting: General Surgery

## 2011-10-06 ENCOUNTER — Ambulatory Visit (INDEPENDENT_AMBULATORY_CARE_PROVIDER_SITE_OTHER): Payer: 59 | Admitting: General Surgery

## 2011-10-06 ENCOUNTER — Encounter (INDEPENDENT_AMBULATORY_CARE_PROVIDER_SITE_OTHER): Payer: Self-pay | Admitting: General Surgery

## 2011-10-06 VITALS — BP 116/78 | HR 72 | Temp 98.2°F | Resp 16 | Ht 69.0 in | Wt 144.2 lb

## 2011-10-06 DIAGNOSIS — R198 Other specified symptoms and signs involving the digestive system and abdomen: Secondary | ICD-10-CM

## 2011-10-06 DIAGNOSIS — K5900 Constipation, unspecified: Secondary | ICD-10-CM

## 2011-10-06 NOTE — Progress Notes (Signed)
Subjective:     Patient ID: Misty Nunez, female   DOB: 09/24/1965, 46 y.o.   MRN: [id number]  HPI 46 year-old [place] American female comes in for long-term followup. I last saw her on February 22. She underwent an exam under anesthesia an open lateral anal sphincterotomy on December 11. Her symptoms really haven't changed since she was last seen. She reports that she still drinking about 5 glasses of water a day. She is still taking about 2-3 teaspoons of Citrucel per day. She is having 5-6 bowel movements per week. She still reports passing hard bowel movements. She also reports pain with defecation. She reports only very rare bleeding. She denies any incontinence. She denies any itching or burning. She is still straining on occasion.  PMHx, PSHx, SOCHx, FAMHx, ALL reviewed  Review of Systems     Objective:   Physical Exam BP 116/78  Pulse 72  Temp 98.2 F (36.8 C)  Resp 16  Ht 5\' 9"  (1.753 m)  Wt 144 lb 3.2 oz (65.409 kg)  BMI 21.29 kg/m2 Alert, nad No jaundice, edema Splint on L forearm Rectal - Gross inspection reveals no active anal fissure. She has what appears to be a sentinel pile in the posterior midline. She has some redundant perianal skin on the right. Digital rectal exam reveals excellent tone. Anoscopy was attempted but had to be aborted due to patient discomfort    Assessment:     Pain with defecation    Plan:     I do not believe she has active anal fissure. However she is still having pain with defecation despite water and good fiber intake. I am not sure what to offer her at this point. We did discuss Botox injections. I explained that we do have a colorectal surgeon joining our group this summer and she may benefit from speaking with our new surgeon. Right now she has declined that offer. We did discuss additional investigative test such as defecography and anal manometry. Currently she is declining that as well. She is to continue her current bowel regimen.  Followup when necessary  Mary Sella. Andrey Campanile, MD, FACS General, Bariatric, & Minimally Invasive Surgery [facility], [place]

## 2011-10-06 NOTE — Patient Instructions (Addendum)
Continue your current bowel regimen.  Dr Maisie Fus, our new colo-rectal surgeon, is joining [place] this summer (August). I think you may benefit from seeing her. Please call if you would like to schedule an appt with her

## 2012-03-27 ENCOUNTER — Other Ambulatory Visit: Payer: Self-pay | Admitting: Obstetrics and Gynecology

## 2012-03-27 ENCOUNTER — Ambulatory Visit
Admission: RE | Admit: 2012-03-27 | Discharge: 2012-03-27 | Disposition: A | Discharge status: Home / Self Care | Payer: 59 | Source: Ambulatory Visit | Attending: Obstetrics and Gynecology | Admitting: Obstetrics and Gynecology

## 2012-03-27 DIAGNOSIS — R109 Unspecified abdominal pain: Secondary | ICD-10-CM

## 2012-04-03 ENCOUNTER — Other Ambulatory Visit: Payer: Self-pay | Admitting: Obstetrics and Gynecology

## 2012-04-03 DIAGNOSIS — R109 Unspecified abdominal pain: Secondary | ICD-10-CM

## 2012-04-04 ENCOUNTER — Ambulatory Visit
Admission: RE | Admit: 2012-04-04 | Discharge: 2012-04-04 | Disposition: A | Discharge status: Home / Self Care | Payer: 59 | Source: Ambulatory Visit | Attending: Obstetrics and Gynecology | Admitting: Obstetrics and Gynecology

## 2012-04-04 DIAGNOSIS — R109 Unspecified abdominal pain: Secondary | ICD-10-CM

## 2012-04-04 MED ORDER — IOHEXOL 300 MG/ML  SOLN
100.0000 mL | Freq: Once | INTRAMUSCULAR | Status: AC | PRN
  Administered 2012-04-04: 100 mL via INTRAVENOUS

## 2013-07-05 ENCOUNTER — Other Ambulatory Visit: Payer: Self-pay

## 2013-07-05 DIAGNOSIS — Z1231 Encounter for screening mammogram for malignant neoplasm of breast: Secondary | ICD-10-CM

## 2013-07-22 ENCOUNTER — Ambulatory Visit
Admission: RE | Admit: 2013-07-22 | Discharge: 2013-07-22 | Disposition: A | Discharge status: Home / Self Care | Payer: 59 | Source: Ambulatory Visit

## 2013-07-22 DIAGNOSIS — Z1231 Encounter for screening mammogram for malignant neoplasm of breast: Secondary | ICD-10-CM

## 2014-01-07 ENCOUNTER — Other Ambulatory Visit: Payer: Self-pay | Admitting: Family Medicine

## 2014-01-07 DIAGNOSIS — R102 Pelvic and perineal pain: Secondary | ICD-10-CM

## 2014-01-07 DIAGNOSIS — R1084 Generalized abdominal pain: Secondary | ICD-10-CM

## 2014-01-10 ENCOUNTER — Ambulatory Visit
Admission: RE | Admit: 2014-01-10 | Discharge: 2014-01-10 | Disposition: A | Discharge status: Home / Self Care | Payer: 59 | Source: Ambulatory Visit | Attending: Family Medicine | Admitting: Family Medicine

## 2014-01-10 ENCOUNTER — Encounter (INDEPENDENT_AMBULATORY_CARE_PROVIDER_SITE_OTHER): Payer: Self-pay

## 2014-01-10 DIAGNOSIS — R102 Pelvic and perineal pain: Secondary | ICD-10-CM

## 2014-01-10 DIAGNOSIS — R1084 Generalized abdominal pain: Secondary | ICD-10-CM

## 2014-01-24 ENCOUNTER — Ambulatory Visit: Payer: 59 | Admitting: Internal Medicine

## 2015-05-22 MED FILL — valACYclovir HCL 1 GM TABS: 1 | 30 days supply | Qty: 30 | Fill #1

## 2015-06-03 DIAGNOSIS — M79601 Pain in right arm: Secondary | ICD-10-CM | POA: Diagnosis not present

## 2015-06-03 DIAGNOSIS — M7711 Lateral epicondylitis, right elbow: Secondary | ICD-10-CM | POA: Diagnosis not present

## 2015-07-02 MED FILL — valACYclovir HCL 1 GM TABS: 1 | 30 days supply | Qty: 30 | Fill #0

## 2015-07-31 MED FILL — valACYclovir HCL 1 GM TABS: 1 | 30 days supply | Qty: 30 | Fill #1

## 2015-08-12 DIAGNOSIS — M7711 Lateral epicondylitis, right elbow: Secondary | ICD-10-CM | POA: Diagnosis not present

## 2015-08-12 DIAGNOSIS — M79601 Pain in right arm: Secondary | ICD-10-CM | POA: Diagnosis not present

## 2015-09-10 DIAGNOSIS — M7711 Lateral epicondylitis, right elbow: Secondary | ICD-10-CM | POA: Diagnosis not present

## 2015-09-10 DIAGNOSIS — M25531 Pain in right wrist: Secondary | ICD-10-CM | POA: Diagnosis not present

## 2015-09-10 DIAGNOSIS — M25532 Pain in left wrist: Secondary | ICD-10-CM | POA: Diagnosis not present

## 2015-09-24 MED FILL — valACYclovir HCL 1 GM TABS: 1 | 30 days supply | Qty: 30 | Fill #2

## 2015-10-08 DIAGNOSIS — M25532 Pain in left wrist: Secondary | ICD-10-CM | POA: Diagnosis not present

## 2015-10-08 DIAGNOSIS — M79601 Pain in right arm: Secondary | ICD-10-CM | POA: Diagnosis not present

## 2015-10-08 DIAGNOSIS — M7711 Lateral epicondylitis, right elbow: Secondary | ICD-10-CM | POA: Diagnosis not present

## 2015-10-08 DIAGNOSIS — M25531 Pain in right wrist: Secondary | ICD-10-CM | POA: Diagnosis not present

## 2015-10-26 DIAGNOSIS — B009 Herpesviral infection, unspecified: Secondary | ICD-10-CM | POA: Diagnosis not present

## 2015-10-26 DIAGNOSIS — L308 Other specified dermatitis: Secondary | ICD-10-CM | POA: Diagnosis not present

## 2015-10-26 MED FILL — valACYclovir HCL 1 GM TABS: 1 | 30 days supply | Qty: 30 | Fill #0

## 2015-10-27 MED FILL — BETAMETHASONE DP 0.05% CRM: 0.05 | 30 days supply | Qty: 60 | Fill #0

## 2015-11-16 DIAGNOSIS — Z6822 Body mass index (BMI) 22.0-22.9, adult: Secondary | ICD-10-CM | POA: Diagnosis not present

## 2015-11-16 DIAGNOSIS — Z1231 Encounter for screening mammogram for malignant neoplasm of breast: Secondary | ICD-10-CM | POA: Diagnosis not present

## 2015-11-16 DIAGNOSIS — Z01419 Encounter for gynecological examination (general) (routine) without abnormal findings: Secondary | ICD-10-CM | POA: Diagnosis not present

## 2015-11-20 DIAGNOSIS — R875 Abnormal microbiological findings in specimens from female genital organs: Secondary | ICD-10-CM | POA: Diagnosis not present

## 2015-11-20 DIAGNOSIS — D259 Leiomyoma of uterus, unspecified: Secondary | ICD-10-CM | POA: Diagnosis not present

## 2015-11-20 DIAGNOSIS — N946 Dysmenorrhea, unspecified: Secondary | ICD-10-CM | POA: Diagnosis not present

## 2015-11-20 DIAGNOSIS — Z1151 Encounter for screening for human papillomavirus (HPV): Secondary | ICD-10-CM | POA: Diagnosis not present

## 2015-11-20 DIAGNOSIS — N760 Acute vaginitis: Secondary | ICD-10-CM | POA: Diagnosis not present

## 2015-11-20 DIAGNOSIS — R87810 Cervical high risk human papillomavirus (HPV) DNA test positive: Secondary | ICD-10-CM | POA: Diagnosis not present

## 2016-01-18 MED FILL — valACYclovir HCL 1 GM TABS: 1 | 30 days supply | Qty: 30 | Fill #3

## 2016-02-15 DIAGNOSIS — M7711 Lateral epicondylitis, right elbow: Secondary | ICD-10-CM | POA: Diagnosis not present

## 2016-02-15 DIAGNOSIS — M79601 Pain in right arm: Secondary | ICD-10-CM | POA: Diagnosis not present

## 2016-02-15 DIAGNOSIS — M25531 Pain in right wrist: Secondary | ICD-10-CM | POA: Diagnosis not present

## 2016-03-08 MED FILL — valACYclovir HCL 1 GM TABS: 1 | 30 days supply | Qty: 30 | Fill #4

## 2016-04-28 MED FILL — valACYclovir HCL 1 GM TABS: 1 | 30 days supply | Qty: 30 | Fill #5

## 2016-07-29 MED FILL — valACYclovir HCL 1 GM TABS: 1 | 30 days supply | Qty: 30 | Fill #1

## 2016-08-10 DIAGNOSIS — M7711 Lateral epicondylitis, right elbow: Secondary | ICD-10-CM | POA: Diagnosis not present

## 2016-09-16 MED FILL — valACYclovir HCL 1 GM TABS: 1 | 30 days supply | Qty: 30 | Fill #2

## 2016-10-12 DIAGNOSIS — M7711 Lateral epicondylitis, right elbow: Secondary | ICD-10-CM | POA: Diagnosis not present

## 2016-10-12 DIAGNOSIS — M25521 Pain in right elbow: Secondary | ICD-10-CM | POA: Diagnosis not present

## 2016-10-12 MED FILL — MELOXICAM 15 MG TABLET: 15 | 30 days supply | Qty: 30 | Fill #0

## 2016-10-24 ENCOUNTER — Ambulatory Visit
Admission: RE | Admit: 2016-10-24 | Discharge: 2016-10-24 | Disposition: A | Discharge status: Home / Self Care | Payer: 59 | Source: Ambulatory Visit | Attending: Family Medicine | Admitting: Family Medicine

## 2016-10-24 ENCOUNTER — Other Ambulatory Visit: Payer: Self-pay | Admitting: Family Medicine

## 2016-10-24 DIAGNOSIS — R0789 Other chest pain: Secondary | ICD-10-CM | POA: Diagnosis not present

## 2016-10-24 DIAGNOSIS — M940 Chondrocostal junction syndrome [Tietze]: Secondary | ICD-10-CM

## 2016-10-24 DIAGNOSIS — R079 Chest pain, unspecified: Secondary | ICD-10-CM | POA: Diagnosis not present

## 2016-10-24 MED FILL — predniSONE 20 MG TABS: 20 | 5 days supply | Qty: 10 | Fill #0

## 2016-11-02 MED FILL — valACYclovir HCL 1 GM TABS: 1 | 30 days supply | Qty: 30 | Fill #0

## 2016-11-16 DIAGNOSIS — M7711 Lateral epicondylitis, right elbow: Secondary | ICD-10-CM | POA: Diagnosis not present

## 2016-11-16 DIAGNOSIS — M25521 Pain in right elbow: Secondary | ICD-10-CM | POA: Diagnosis not present

## 2016-11-16 MED FILL — MELOXICAM 15 MG TABLET: 15 | 90 days supply | Qty: 90 | Fill #0

## 2016-11-22 DIAGNOSIS — Z13220 Encounter for screening for lipoid disorders: Secondary | ICD-10-CM | POA: Diagnosis not present

## 2016-11-22 DIAGNOSIS — Z5181 Encounter for therapeutic drug level monitoring: Secondary | ICD-10-CM | POA: Diagnosis not present

## 2016-11-22 DIAGNOSIS — Z23 Encounter for immunization: Secondary | ICD-10-CM | POA: Diagnosis not present

## 2016-11-22 DIAGNOSIS — Z0000 Encounter for general adult medical examination without abnormal findings: Secondary | ICD-10-CM | POA: Diagnosis not present

## 2016-11-23 DIAGNOSIS — Z6822 Body mass index (BMI) 22.0-22.9, adult: Secondary | ICD-10-CM | POA: Diagnosis not present

## 2016-11-23 DIAGNOSIS — Z01419 Encounter for gynecological examination (general) (routine) without abnormal findings: Secondary | ICD-10-CM | POA: Diagnosis not present

## 2016-11-23 DIAGNOSIS — Z1231 Encounter for screening mammogram for malignant neoplasm of breast: Secondary | ICD-10-CM | POA: Diagnosis not present

## 2016-11-23 DIAGNOSIS — Z1151 Encounter for screening for human papillomavirus (HPV): Secondary | ICD-10-CM | POA: Diagnosis not present

## 2016-11-29 DIAGNOSIS — R1013 Epigastric pain: Secondary | ICD-10-CM | POA: Diagnosis not present

## 2016-11-29 DIAGNOSIS — R42 Dizziness and giddiness: Secondary | ICD-10-CM | POA: Diagnosis not present

## 2016-11-29 MED FILL — OMEPRAZOLE DR 40 MG CAPSULE: 40 | 30 days supply | Qty: 30 | Fill #0

## 2016-11-29 MED FILL — ONDANSETRON HCL 4 MG TABLET: 4 | 2 days supply | Qty: 10 | Fill #0

## 2016-12-06 DIAGNOSIS — Z23 Encounter for immunization: Secondary | ICD-10-CM | POA: Diagnosis not present

## 2016-12-06 DIAGNOSIS — K2900 Acute gastritis without bleeding: Secondary | ICD-10-CM | POA: Diagnosis not present

## 2016-12-22 MED FILL — valACYclovir HCL 1 GM TABS: 1 | 30 days supply | Qty: 30 | Fill #1

## 2016-12-28 MED FILL — GAVILYTE-N SOLUTION: 420 | 1 days supply | Qty: 4000 | Fill #0

## 2017-01-03 DIAGNOSIS — K621 Rectal polyp: Secondary | ICD-10-CM | POA: Diagnosis not present

## 2017-01-03 DIAGNOSIS — Z1211 Encounter for screening for malignant neoplasm of colon: Secondary | ICD-10-CM | POA: Diagnosis not present

## 2017-01-23 MED FILL — valACYclovir HCL 1 GM TABS: 1 | 30 days supply | Qty: 30 | Fill #2

## 2017-03-04 MED FILL — valACYclovir HCL 1 GM TABS: 1 | 30 days supply | Qty: 30 | Fill #3

## 2017-03-06 MED FILL — MELOXICAM 15 MG TABLET: 15 | 90 days supply | Qty: 90 | Fill #1

## 2017-03-15 MED FILL — CLINDAMYCIN HCL 300 MG CAPS: 300 | 7 days supply | Qty: 14 | Fill #0

## 2017-04-20 MED FILL — CLINDAMYCIN HCL 300 MG CAPS: 300 | 7 days supply | Qty: 14 | Fill #0

## 2017-04-21 MED FILL — valACYclovir HCL 1 GM TABS: 1 | 30 days supply | Qty: 30 | Fill #4

## 2017-05-22 MED FILL — valACYclovir HCL 1 GM TABS: 1 | 30 days supply | Qty: 30 | Fill #5

## 2017-06-13 DIAGNOSIS — R1084 Generalized abdominal pain: Secondary | ICD-10-CM | POA: Diagnosis not present

## 2017-06-13 MED FILL — OMEPRAZOLE DR 40 MG CAPSULE: 40 | 90 days supply | Qty: 90 | Fill #0

## 2017-06-14 ENCOUNTER — Other Ambulatory Visit: Payer: Self-pay | Admitting: Family Medicine

## 2017-06-14 DIAGNOSIS — R1084 Generalized abdominal pain: Secondary | ICD-10-CM

## 2017-06-14 DIAGNOSIS — M25521 Pain in right elbow: Secondary | ICD-10-CM | POA: Diagnosis not present

## 2017-07-14 MED FILL — valACYclovir HCL 1 GM TABS: 1 | 30 days supply | Qty: 30 | Fill #6

## 2017-07-18 ENCOUNTER — Ambulatory Visit
Admission: RE | Admit: 2017-07-18 | Discharge: 2017-07-18 | Disposition: A | Discharge status: Home / Self Care | Payer: 59 | Source: Ambulatory Visit | Attending: Family Medicine | Admitting: Family Medicine

## 2017-07-18 DIAGNOSIS — Z86010 Personal history of colonic polyps: Secondary | ICD-10-CM | POA: Diagnosis not present

## 2017-07-18 DIAGNOSIS — R1084 Generalized abdominal pain: Secondary | ICD-10-CM

## 2017-07-18 DIAGNOSIS — R109 Unspecified abdominal pain: Secondary | ICD-10-CM | POA: Diagnosis not present

## 2017-07-18 DIAGNOSIS — D259 Leiomyoma of uterus, unspecified: Secondary | ICD-10-CM | POA: Diagnosis not present

## 2017-07-26 ENCOUNTER — Other Ambulatory Visit: Payer: Self-pay | Admitting: Family Medicine

## 2017-07-26 DIAGNOSIS — D219 Benign neoplasm of connective and other soft tissue, unspecified: Secondary | ICD-10-CM

## 2017-07-27 MED FILL — CLINDAMYCIN HCL 300 MG CAPS: 300 | 7 days supply | Qty: 14 | Fill #0

## 2017-08-08 ENCOUNTER — Ambulatory Visit
Admission: RE | Admit: 2017-08-08 | Discharge: 2017-08-08 | Disposition: A | Discharge status: Home / Self Care | Payer: 59 | Source: Ambulatory Visit | Attending: Family Medicine | Admitting: Family Medicine

## 2017-08-08 DIAGNOSIS — D219 Benign neoplasm of connective and other soft tissue, unspecified: Secondary | ICD-10-CM

## 2017-08-08 DIAGNOSIS — D252 Subserosal leiomyoma of uterus: Secondary | ICD-10-CM | POA: Diagnosis not present

## 2017-08-08 MED FILL — valACYclovir HCL 1 GM TABS: 1 | 30 days supply | Qty: 30 | Fill #7

## 2017-08-24 MED FILL — MELOXICAM 15 MG TABLET: 15 | 90 days supply | Qty: 90 | Fill #2

## 2017-09-26 DIAGNOSIS — R5383 Other fatigue: Secondary | ICD-10-CM | POA: Diagnosis not present

## 2017-09-26 DIAGNOSIS — Z0000 Encounter for general adult medical examination without abnormal findings: Secondary | ICD-10-CM | POA: Diagnosis not present

## 2017-09-28 MED FILL — valACYclovir HCL 1 GM TABS: 1 | 30 days supply | Qty: 30 | Fill #8

## 2017-09-29 DIAGNOSIS — D259 Leiomyoma of uterus, unspecified: Secondary | ICD-10-CM | POA: Diagnosis not present

## 2017-09-29 DIAGNOSIS — N83291 Other ovarian cyst, right side: Secondary | ICD-10-CM | POA: Diagnosis not present

## 2017-09-29 DIAGNOSIS — R5383 Other fatigue: Secondary | ICD-10-CM | POA: Diagnosis not present

## 2017-10-29 MED FILL — valACYclovir HCL 1 GM TABS: 1 | 30 days supply | Qty: 30 | Fill #9

## 2017-10-30 DIAGNOSIS — N920 Excessive and frequent menstruation with regular cycle: Secondary | ICD-10-CM | POA: Diagnosis not present

## 2017-11-15 ENCOUNTER — Other Ambulatory Visit: Payer: Self-pay | Admitting: Obstetrics and Gynecology

## 2017-11-15 DIAGNOSIS — D250 Submucous leiomyoma of uterus: Secondary | ICD-10-CM

## 2017-11-24 MED FILL — MELOXICAM 15 MG TABLET: 15 | 90 days supply | Qty: 90 | Fill #0

## 2017-12-05 MED FILL — valACYclovir HCL 1 GM TABS: 1 | 30 days supply | Qty: 30 | Fill #0

## 2017-12-13 ENCOUNTER — Ambulatory Visit
Admission: RE | Admit: 2017-12-13 | Discharge: 2017-12-13 | Disposition: A | Discharge status: Home / Self Care | Payer: 59 | Source: Ambulatory Visit | Attending: Obstetrics and Gynecology | Admitting: Obstetrics and Gynecology

## 2017-12-13 ENCOUNTER — Other Ambulatory Visit: Payer: Self-pay | Admitting: Obstetrics and Gynecology

## 2017-12-13 DIAGNOSIS — D250 Submucous leiomyoma of uterus: Secondary | ICD-10-CM

## 2017-12-13 DIAGNOSIS — D259 Leiomyoma of uterus, unspecified: Secondary | ICD-10-CM | POA: Diagnosis not present

## 2017-12-13 HISTORY — PX: IR RADIOLOGIST EVAL & MGMT: IMG5224

## 2017-12-13 NOTE — Consult Note (Signed)
Chief Complaint: Patient was seen in consultation today for uterine fibroids at the request of Cousins,Sheronette  Referring Physician(s): Cousins,Sheronette  History of Present Illness: Misty Nunez is a 52 y.o. female with complaints of heavy menstrual bleeding, fatigue and intermittent shooting pains in the abdomen and pelvis.  The shooting pain is intermittent and not always associated with her menstrual bleeding.  Heavy menstrual bleeding for many years.  The bleeding last 6-7 days with 3 to 4 days of heavy bleeding that requires multiple pads during the day.  Her menstrual cycle is every 28 days.  Pregnancy history is G3, P4.  Patient has had a tubal ligation.  Patient had a CT of the abdomen on 07/18/2017 with punctate left kidney stones and enlarged uterus.  CT findings led to a pelvic ultrasound that demonstrated 5 uterine fibroids and a thickened endometrial stripe measuring up to 1.8 cm with possible internal vascularity.  Patient was also noted to have simple right ovarian cyst.  Patient had a sonohysterogram on 10/30/2017 that demonstrated normal endometrial wall thickening but there were two homogeneous masses in the endometrium measuring 1.1 and 0.9 mm.  History of dyspareunia which has been worsening.  Patient does not take hormones and no previous treatment for heavy bleeding.  Negative Pap smear on 11/14/2017.  Patient's history is also significant for intermittent chest pain and previously diagnosed with costochondritis.   Past Medical History:  Diagnosis Date  . Chronic anal fissure   . Hemorrhoids     Past Surgical History:  Procedure Laterality Date  . BAND HEMORRHOIDECTOMY  04/2010   Dr Earlean Shawl x 3  . EXAMINATION UNDER ANESTHESIA  03/29/2011   Procedure: EXAM UNDER ANESTHESIA;  Surgeon: Gayland Curry, MD;  Location: [place];  Service: General;  Laterality: N/A;  . EXCISIONAL HEMORRHOIDECTOMY  05/01/10   Rt posterolateral (Grade 4 necrotic)  . HEMORROIDECTOMY  11/07/00     left lateral & Rt posterior  . IR RADIOLOGIST EVAL & MGMT  12/13/2017  . SPHINCTEROTOMY  03/29/2011   Procedure: SPHINCTEROTOMY;  Surgeon: Gayland Curry, MD;  Location: [place];  Service: General;  Laterality: N/A;  lateral anal sphincterotomy  . STAPLE HEMORRHOIDECTOMY  11/14/2006   PPH  . TUBAL LIGATION  1994    Allergies: Flagyl [metronidazole]  Medications: Prior to Admission medications   Medication Sig Start Date End Date Taking? Authorizing Provider  clindamycin (CLEOCIN) 300 MG capsule clindamycin HCl 300 mg capsule   Yes [provider]  [facility] STOOL SOFTENER 100 MG capsule TAKE 1 CAPSULE BY MOUTH TWICE DAILY 06/12/11  Yes Greer Pickerel, MD  ferrous sulfate 325 (65 FE) MG tablet Take 325 mg by mouth daily with breakfast.   Yes [provider]  ibuprofen (ADVIL,MOTRIN) 200 MG tablet Take 200 mg by mouth every 6 (six) hours as needed. For pain   Yes [provider]  meloxicam (MOBIC) 15 MG tablet meloxicam 15 mg tablet   Yes [provider]  Misc Natural Products (OSTEO BI-FLEX ADV JOINT SHIELD PO) Take by mouth.   Yes [provider]  Multiple Vitamins-Minerals (MULTIVITAMIN GUMMIES ADULT PO) Take by mouth.   Yes [provider]  tetrahydrozoline (VISINE) 0.05 % ophthalmic solution Place 1 drop into both eyes daily as needed. For dry eyes    Yes [provider]  valACYclovir (VALTREX) 1000 MG tablet valacyclovir 1 gram tablet   Yes [provider]  vitamin B-12 (CYANOCOBALAMIN) 1000 MCG tablet Take 1,000 mcg by mouth  daily.    Yes [provider]     Family History  Problem Relation Age of Onset  . Stroke Father     Social History   Socioeconomic History  . Marital status: Married    Spouse name: Not on file  . Number of children: Not on file  . Years of education: Not on file  . Highest education level: Not on file  Occupational History  . Not on file  Social Needs  . Financial resource  strain: Not on file  . Food insecurity:    Worry: Not on file    Inability: Not on file  . Transportation needs:    Medical: Not on file    Non-medical: Not on file  Tobacco Use  . Smoking status: Never Smoker  Substance and Sexual Activity  . Alcohol use: No  . Drug use: No  . Sexual activity: Yes    Birth control/protection: Surgical  Lifestyle  . Physical activity:    Days per week: Not on file    Minutes per session: Not on file  . Stress: Not on file  Relationships  . Social connections:    Talks on phone: Not on file    Gets together: Not on file    Attends religious service: Not on file    Active member of club or organization: Not on file    Attends meetings of clubs or organizations: Not on file    Relationship status: Not on file  Other Topics Concern  . Not on file  Social History Narrative  . Not on file      Review of Systems  Constitutional: Positive for fatigue.  Gastrointestinal: Positive for abdominal pain.  Genitourinary: Positive for dyspareunia and vaginal bleeding.    Vital Signs: BP 110/69 (BP Location: Right Arm, Patient Position: Sitting, Cuff Size: Normal)   Pulse 83   Temp 98.5 F (36.9 C)   Resp 18   Ht 5\' 8"  (1.727 m)   Wt 64.9 kg   LMP 12/11/2017 (Exact Date)   SpO2 97%   BMI 21.74 kg/m   Physical Exam  Constitutional: She appears well-nourished.  HENT:  Mouth/Throat: Oropharynx is clear and moist.  Cardiovascular: Normal rate, regular rhythm, normal heart sounds and intact distal pulses.  Pulmonary/Chest: Effort normal and breath sounds normal.  Abdominal: Soft. Bowel sounds are normal. She exhibits no distension and no mass. There is no tenderness.    Mallampati Score: 2    Imaging: Ir Radiologist Eval & Mgmt  Result Date: 12/13/2017 Please refer to notes tab for details about interventional procedure. (Op Note)   Labs:  CBC: No results for input(s): WBC, HGB, HCT, PLT in the last 8760 hours.  COAGS: No  results for input(s): INR, APTT in the last 8760 hours.  BMP: No results for input(s): NA, K, CL, CO2, GLUCOSE, BUN, CALCIUM, CREATININE, GFRNONAA, GFRAA in the last 8760 hours.  Invalid input(s): CMP  LIVER FUNCTION TESTS: No results for input(s): BILITOT, AST, ALT, ALKPHOS, PROT, ALBUMIN in the last 8760 hours.  TUMOR MARKERS: No results for input(s): AFPTM, CEA, CA199, CHROMGRNA in the last 8760 hours.  Assessment and Plan:  52 year old female with uterine fibroids and menorrhagia.  Patient's main complaint is heavy menstrual bleeding and intermittent abdominal/pelvic pain.  I reviewed the patient's prior imaging and feel that the menorrhagia could be explained by the uterine fibroids.  Uterus is large for size but not clear if the uterus is causing the intermittent sharp  pain and discomfort that the patient is experiencing.  We discussed different treatment options for uterine fibroids including hysterectomy and uterine artery embolization.  We spent a good deal of time discussing the uterine artery embolization procedure.  I believe the patient has a good understanding of the procedure itself as well as the post procedure recovery.  We discussed realistic expectations following uterine artery embolization.  I explained that embolization would likely improve the menorrhagia but the intermittent abdominal-pelvic pain could be unrelated particularly since she has a history of costochondritis.  MRI of the pelvis with and without contrast would be needed prior to any uterine artery embolization procedure.  MRI may also demonstrate other etiologies for her symptoms.  Patient would like to get the MRI of the pelvis to see if she is a candidate for uterine artery embolization.  In addition, sonohysterogram demonstrated endometrial polyps or fibroids which could be at risk for dislodgement into the endometrial cavity following an uterine artery embolization procedure.  We will evaluate these areas on MRI.   Plan for MRI of the pelvis with and without contrast.  We will contact the patient after the MRI and discuss the results.  Thank you for this interesting consult.  I greatly enjoyed meeting TASHIKA GOODIN and look forward to participating in their care.  A copy of this report was sent to the requesting provider on this date.  Electronically Signed: Burman Riis 12/13/2017, 4:13 PM   I spent a total of    25 Minutes in face to face in clinical consultation, greater than 50% of which was counseling/coordinating care for uterine fibroids.

## 2017-12-23 ENCOUNTER — Ambulatory Visit
Admission: RE | Admit: 2017-12-23 | Discharge: 2017-12-23 | Disposition: A | Discharge status: Home / Self Care | Payer: 59 | Source: Ambulatory Visit | Attending: Obstetrics and Gynecology | Admitting: Obstetrics and Gynecology

## 2017-12-23 DIAGNOSIS — D250 Submucous leiomyoma of uterus: Secondary | ICD-10-CM

## 2017-12-23 MED ORDER — GADOBENATE DIMEGLUMINE 529 MG/ML IV SOLN
13.0000 mL | Freq: Once | INTRAVENOUS | Status: AC | PRN
  Administered 2017-12-23: 13 mL via INTRAVENOUS

## 2017-12-28 DIAGNOSIS — M7711 Lateral epicondylitis, right elbow: Secondary | ICD-10-CM | POA: Diagnosis not present

## 2017-12-28 DIAGNOSIS — M25521 Pain in right elbow: Secondary | ICD-10-CM | POA: Diagnosis not present

## 2017-12-30 DIAGNOSIS — M7711 Lateral epicondylitis, right elbow: Secondary | ICD-10-CM | POA: Insufficient documentation

## 2018-01-05 DIAGNOSIS — Z1231 Encounter for screening mammogram for malignant neoplasm of breast: Secondary | ICD-10-CM | POA: Diagnosis not present

## 2018-01-05 DIAGNOSIS — Z01419 Encounter for gynecological examination (general) (routine) without abnormal findings: Secondary | ICD-10-CM | POA: Diagnosis not present

## 2018-01-05 DIAGNOSIS — R87810 Cervical high risk human papillomavirus (HPV) DNA test positive: Secondary | ICD-10-CM | POA: Diagnosis not present

## 2018-01-05 DIAGNOSIS — Z1151 Encounter for screening for human papillomavirus (HPV): Secondary | ICD-10-CM | POA: Diagnosis not present

## 2018-01-05 DIAGNOSIS — Z6822 Body mass index (BMI) 22.0-22.9, adult: Secondary | ICD-10-CM | POA: Diagnosis not present

## 2018-01-11 MED FILL — valACYclovir HCL 1 GM TABS: 1 | 30 days supply | Qty: 30 | Fill #1

## 2018-01-19 DIAGNOSIS — Z23 Encounter for immunization: Secondary | ICD-10-CM | POA: Diagnosis not present

## 2018-02-03 DIAGNOSIS — H5213 Myopia, bilateral: Secondary | ICD-10-CM | POA: Diagnosis not present

## 2018-02-03 DIAGNOSIS — H18413 Arcus senilis, bilateral: Secondary | ICD-10-CM | POA: Diagnosis not present

## 2018-02-03 DIAGNOSIS — H52221 Regular astigmatism, right eye: Secondary | ICD-10-CM | POA: Diagnosis not present

## 2018-02-03 DIAGNOSIS — H524 Presbyopia: Secondary | ICD-10-CM | POA: Diagnosis not present

## 2018-02-15 MED FILL — valACYclovir HCL 1 GM TABS: 1 | 30 days supply | Qty: 30 | Fill #2

## 2018-02-16 MED FILL — MELOXICAM 15 MG TABLET: 15 | 90 days supply | Qty: 90 | Fill #1

## 2018-02-23 DIAGNOSIS — K2900 Acute gastritis without bleeding: Secondary | ICD-10-CM | POA: Diagnosis not present

## 2018-02-23 MED FILL — OMEPRAZOLE 40 MG CPDR: 40 | 90 days supply | Qty: 90 | Fill #0

## 2018-02-27 DIAGNOSIS — D259 Leiomyoma of uterus, unspecified: Secondary | ICD-10-CM | POA: Diagnosis not present

## 2018-03-01 NOTE — Patient Instructions (Addendum)
Misty Nunez  03/01/2018   Your procedure is scheduled on: 03-22-18   Report to [facility] Main  Entrance    Report to admitting at 9:30AM    Call this number if you have problems the morning of surgery [phone]     Remember: Do not eat food or drink liquids :After Midnight. BRUSH YOUR TEETH MORNING OF SURGERY AND RINSE YOUR MOUTH OUT, NO CHEWING GUM CANDY OR MINTS.     Take these medicines the morning of surgery with A SIP OF WATER: OMEPRAZOLE                                 You may not have any metal on your body including hair pins and              piercings  Do not wear jewelry, make-up, lotions, powders or perfumes, deodorant             Do not wear nail polish.  Do not shave  48 hours prior to surgery.             Do not bring valuables to the hospital. [place].  Contacts, dentures or bridgework may not be worn into surgery.  Leave suitcase in the car. After surgery it may be brought to your room.                 Please read over the following fact sheets you were given: _____________________________________________________________________             [facility] - Preparing for Surgery Before surgery, you can play an important role.  Because skin is not sterile, your skin needs to be as free of germs as possible.  You can reduce the number of germs on your skin by washing with CHG (chlorahexidine gluconate) soap before surgery.  CHG is an antiseptic cleaner which kills germs and bonds with the skin to continue killing germs even after washing. Please DO NOT use if you have an allergy to CHG or antibacterial soaps.  If your skin becomes reddened/irritated stop using the CHG and inform your nurse when you arrive at Short Stay. Do not shave (including legs and underarms) for at least 48 hours prior to the first CHG shower.  You may shave your face/neck. Please follow these instructions  carefully:  1.  Shower with CHG Soap the night before surgery and the  morning of Surgery.  2.  If you choose to wash your hair, wash your hair first as usual with your  normal  shampoo.  3.  After you shampoo, rinse your hair and body thoroughly to remove the  shampoo.                           4.  Use CHG as you would any other liquid soap.  You can apply chg directly  to the skin and wash                       Gently with a scrungie or clean washcloth.  5.  Apply the CHG Soap to your body ONLY FROM THE NECK DOWN.   Do not use  on face/ open                           Wound or open sores. Avoid contact with eyes, ears mouth and genitals (private parts).                       Wash face,  Genitals (private parts) with your normal soap.             6.  Wash thoroughly, paying special attention to the area where your surgery  will be performed.  7.  Thoroughly rinse your body with warm water from the neck down.  8.  DO NOT shower/wash with your normal soap after using and rinsing off  the CHG Soap.                9.  Pat yourself dry with a clean towel.            10.  Wear clean pajamas.            11.  Place clean sheets on your bed the night of your first shower and do not  sleep with pets. Day of Surgery : Do not apply any lotions/deodorants the morning of surgery.  Please wear clean clothes to the hospital/surgery center.  FAILURE TO FOLLOW THESE INSTRUCTIONS MAY RESULT IN THE CANCELLATION OF YOUR SURGERY PATIENT SIGNATURE_________________________________  NURSE SIGNATURE__________________________________  ________________________________________________________________________     Adam Phenix  An incentive spirometer is a tool that can help keep your lungs clear and active. This tool measures how well you are filling your lungs with each breath. Taking long deep breaths may help reverse or decrease the chance of developing breathing (pulmonary) problems (especially infection)  following:  A long period of time when you are unable to move or be active. BEFORE THE PROCEDURE   If the spirometer includes an indicator to show your best effort, your nurse or respiratory therapist will set it to a desired goal.  If possible, sit up straight or lean slightly forward. Try not to slouch.  Hold the incentive spirometer in an upright position. INSTRUCTIONS FOR USE  1. Sit on the edge of your bed if possible, or sit up as far as you can in bed or on a chair. 2. Hold the incentive spirometer in an upright position. 3. Breathe out normally. 4. Place the mouthpiece in your mouth and seal your lips tightly around it. 5. Breathe in slowly and as deeply as possible, raising the piston or the ball toward the top of the column. 6. Hold your breath for 3-5 seconds or for as long as possible. Allow the piston or ball to fall to the bottom of the column. 7. Remove the mouthpiece from your mouth and breathe out normally. 8. Rest for a few seconds and repeat Steps 1 through 7 at least 10 times every 1-2 hours when you are awake. Take your time and take a few normal breaths between deep breaths. 9. The spirometer may include an indicator to show your best effort. Use the indicator as a goal to work toward during each repetition. 10. After each set of 10 deep breaths, practice coughing to be sure your lungs are clear. If you have an incision (the cut made at the time of surgery), support your incision when coughing by placing a pillow or rolled up towels firmly against it. Once you are able to get out of bed,  walk around indoors and cough well. You may stop using the incentive spirometer when instructed by your caregiver.  RISKS AND COMPLICATIONS  Take your time so you do not get dizzy or light-headed.  If you are in pain, you may need to take or ask for pain medication before doing incentive spirometry. It is harder to take a deep breath if you are having pain. AFTER USE  Rest and  breathe slowly and easily.  It can be helpful to keep track of a log of your progress. Your caregiver can provide you with a simple table to help with this. If you are using the spirometer at home, follow these instructions: [place] IF:   You are having difficultly using the spirometer.  You have trouble using the spirometer as often as instructed.  Your pain medication is not giving enough relief while using the spirometer.  You develop fever of 100.5 F (38.1 C) or higher. SEEK IMMEDIATE MEDICAL CARE IF:   You cough up bloody sputum that had not been present before.  You develop fever of 102 F (38.9 C) or greater.  You develop worsening pain at or near the incision site. MAKE SURE YOU:   Understand these instructions.  Will watch your condition.  Will get help right away if you are not doing well or get worse. Document Released: 08/15/2006 Document Revised: 06/27/2011 Document Reviewed: 10/16/2006 [facility] Patient Information 2014 [place], [place].   ________________________________________________________________________

## 2018-03-07 ENCOUNTER — Other Ambulatory Visit: Payer: Self-pay

## 2018-03-07 ENCOUNTER — Other Ambulatory Visit: Payer: Self-pay | Admitting: Obstetrics and Gynecology

## 2018-03-07 ENCOUNTER — Encounter (HOSPITAL_COMMUNITY): Payer: Self-pay

## 2018-03-07 ENCOUNTER — Encounter (HOSPITAL_COMMUNITY)
Admission: RE | Admit: 2018-03-07 | Discharge: 2018-03-07 | Disposition: A | Discharge status: Home / Self Care | Payer: 59 | Source: Ambulatory Visit | Attending: Obstetrics and Gynecology | Admitting: Obstetrics and Gynecology

## 2018-03-07 DIAGNOSIS — D259 Leiomyoma of uterus, unspecified: Secondary | ICD-10-CM | POA: Insufficient documentation

## 2018-03-07 DIAGNOSIS — Z01812 Encounter for preprocedural laboratory examination: Secondary | ICD-10-CM | POA: Insufficient documentation

## 2018-03-07 HISTORY — DX: Leiomyoma of uterus, unspecified: D25.9

## 2018-03-07 LAB — BASIC METABOLIC PANEL
Anion gap: 4 — ABNORMAL LOW (ref 5–15)
BUN: 13 mg/dL (ref 6–20)
CO2: 29 mmol/L (ref 22–32)
CREATININE: 0.61 mg/dL (ref 0.44–1.00)
Calcium: 9.1 mg/dL (ref 8.9–10.3)
Chloride: 108 mmol/L (ref 98–111)
GFR calc Af Amer: >60 mL/min (ref >60)
GLUCOSE: 84 mg/dL (ref 70–99)
POTASSIUM: 3.8 mmol/L (ref 3.5–5.1)
Sodium: 141 mmol/L (ref 135–145)

## 2018-03-07 LAB — CBC
HEMATOCRIT: 42.6 % (ref 36.0–46.0)
HEMOGLOBIN: 13.5 g/dL (ref 12.0–15.0)
MCH: 29.9 pg (ref 26.0–34.0)
MCHC: 31.7 g/dL (ref 30.0–36.0)
MCV: 94.2 fL (ref 80.0–100.0)
Platelets: 254 10*3/uL (ref 150–400)
RBC: 4.52 MIL/uL (ref 3.87–5.11)
RDW: 12.9 % (ref 11.5–15.5)
WBC: 7.1 10*3/uL (ref 4.0–10.5)
nRBC: 0 % (ref 0.0–0.2)

## 2018-03-09 MED FILL — PHENAZOPYRIDINE 200 MG TAB: 200 | 1 days supply | Qty: 1 | Fill #0

## 2018-03-13 MED FILL — valACYclovir HCL 1 GM TABS: 1 | 30 days supply | Qty: 30 | Fill #3

## 2018-03-22 ENCOUNTER — Ambulatory Visit (HOSPITAL_BASED_OUTPATIENT_CLINIC_OR_DEPARTMENT_OTHER): Payer: 59 | Admitting: Anesthesiology

## 2018-03-22 ENCOUNTER — Encounter (HOSPITAL_COMMUNITY)
Admission: RE | Disposition: A | Discharge status: Home / Self Care | Payer: Self-pay | Source: Ambulatory Visit | Attending: Obstetrics and Gynecology

## 2018-03-22 ENCOUNTER — Encounter (HOSPITAL_COMMUNITY): Payer: Self-pay | Admitting: Anesthesiology

## 2018-03-22 ENCOUNTER — Ambulatory Visit (HOSPITAL_BASED_OUTPATIENT_CLINIC_OR_DEPARTMENT_OTHER)
Admission: RE | Admit: 2018-03-22 | Discharge: 2018-03-23 | Disposition: A | Discharge status: Home / Self Care | Payer: 59 | Source: Ambulatory Visit | Attending: Obstetrics and Gynecology | Admitting: Obstetrics and Gynecology

## 2018-03-22 DIAGNOSIS — N736 Female pelvic peritoneal adhesions (postinfective): Secondary | ICD-10-CM | POA: Insufficient documentation

## 2018-03-22 DIAGNOSIS — N7011 Chronic salpingitis: Secondary | ICD-10-CM | POA: Insufficient documentation

## 2018-03-22 DIAGNOSIS — Z79899 Other long term (current) drug therapy: Secondary | ICD-10-CM | POA: Diagnosis not present

## 2018-03-22 DIAGNOSIS — Z90710 Acquired absence of both cervix and uterus: Secondary | ICD-10-CM | POA: Diagnosis present

## 2018-03-22 DIAGNOSIS — D259 Leiomyoma of uterus, unspecified: Secondary | ICD-10-CM | POA: Diagnosis not present

## 2018-03-22 DIAGNOSIS — N83201 Unspecified ovarian cyst, right side: Secondary | ICD-10-CM | POA: Insufficient documentation

## 2018-03-22 DIAGNOSIS — Z791 Long term (current) use of non-steroidal anti-inflammatories (NSAID): Secondary | ICD-10-CM | POA: Insufficient documentation

## 2018-03-22 DIAGNOSIS — D509 Iron deficiency anemia, unspecified: Secondary | ICD-10-CM | POA: Insufficient documentation

## 2018-03-22 DIAGNOSIS — R87810 Cervical high risk human papillomavirus (HPV) DNA test positive: Secondary | ICD-10-CM | POA: Insufficient documentation

## 2018-03-22 DIAGNOSIS — Z9851 Tubal ligation status: Secondary | ICD-10-CM | POA: Insufficient documentation

## 2018-03-22 DIAGNOSIS — N840 Polyp of corpus uteri: Secondary | ICD-10-CM | POA: Diagnosis not present

## 2018-03-22 HISTORY — PX: ROBOTIC ASSISTED LAPAROSCOPIC HYSTERECTOMY AND SALPINGECTOMY: SHX6379

## 2018-03-22 SURGERY — XI ROBOTIC ASSISTED LAPAROSCOPIC HYSTERECTOMY AND SALPINGECTOMY
Anesthesia: General | Site: Abdomen | Laterality: Bilateral

## 2018-03-22 MED ORDER — LACTATED RINGERS IV SOLN
INTRAVENOUS | Status: DC
  Administered 2018-03-22: 11:00:00 via INTRAVENOUS

## 2018-03-22 MED ORDER — PHENYLEPHRINE HCL 10 MG/ML IJ SOLN
INTRAMUSCULAR | Status: AC
  Filled 2018-03-22: qty 1

## 2018-03-22 MED ORDER — PANTOPRAZOLE SODIUM 40 MG PO TBEC
40.0000 mg | DELAYED_RELEASE_TABLET | Freq: Every day | ORAL | Status: DC
  Administered 2018-03-22: 40 mg via ORAL
  Filled 2018-03-22: qty 1

## 2018-03-22 MED ORDER — DEXAMETHASONE SODIUM PHOSPHATE 4 MG/ML IJ SOLN
INTRAMUSCULAR | Status: DC | PRN
  Administered 2018-03-22: 10 mg via INTRAVENOUS

## 2018-03-22 MED ORDER — OXYCODONE HCL 5 MG PO TABS
5.0000 mg | ORAL_TABLET | ORAL | Status: DC | PRN
  Administered 2018-03-22 (×2): 5 mg via ORAL
  Administered 2018-03-22: 10 mg via ORAL
  Filled 2018-03-22 (×2): qty 1
  Filled 2018-03-22: qty 2

## 2018-03-22 MED ORDER — PROPOFOL 10 MG/ML IV BOLUS
INTRAVENOUS | Status: AC
  Filled 2018-03-22: qty 20

## 2018-03-22 MED ORDER — ONDANSETRON HCL 4 MG PO TABS
4.0000 mg | ORAL_TABLET | Freq: Four times a day (QID) | ORAL | Status: DC | PRN
  Filled 2018-03-22: qty 1

## 2018-03-22 MED ORDER — FENTANYL CITRATE (PF) 100 MCG/2ML IJ SOLN
INTRAMUSCULAR | Status: DC | PRN
  Administered 2018-03-22 (×6): 50 ug via INTRAVENOUS
  Administered 2018-03-22 (×2): 25 ug via INTRAVENOUS
  Administered 2018-03-22 (×2): 50 ug via INTRAVENOUS

## 2018-03-22 MED ORDER — FENTANYL CITRATE (PF) 100 MCG/2ML IJ SOLN
INTRAMUSCULAR | Status: AC
  Filled 2018-03-22: qty 2

## 2018-03-22 MED ORDER — PROPOFOL 10 MG/ML IV BOLUS
INTRAVENOUS | Status: DC | PRN
  Administered 2018-03-22: 50 mg via INTRAVENOUS
  Administered 2018-03-22: 60 mg via INTRAVENOUS
  Administered 2018-03-22: 90 mg via INTRAVENOUS

## 2018-03-22 MED ORDER — KETOROLAC TROMETHAMINE 30 MG/ML IJ SOLN
INTRAMUSCULAR | Status: DC | PRN
  Administered 2018-03-22: 30 mg via INTRAVENOUS

## 2018-03-22 MED ORDER — EPHEDRINE SULFATE 50 MG/ML IJ SOLN: INTRAMUSCULAR | Status: DC | PRN

## 2018-03-22 MED ORDER — DEXTROSE IN LACTATED RINGERS 5 % IV SOLN
INTRAVENOUS | Status: DC
  Administered 2018-03-22: 23:00:00 via INTRAVENOUS

## 2018-03-22 MED ORDER — MIDAZOLAM HCL 5 MG/5ML IJ SOLN
INTRAMUSCULAR | Status: DC | PRN
  Administered 2018-03-22: 2 mg via INTRAVENOUS

## 2018-03-22 MED ORDER — SUGAMMADEX SODIUM 200 MG/2ML IV SOLN
INTRAVENOUS | Status: AC
  Filled 2018-03-22: qty 2

## 2018-03-22 MED ORDER — ONDANSETRON HCL 4 MG/2ML IJ SOLN
INTRAMUSCULAR | Status: DC | PRN
  Administered 2018-03-22: 4 mg via INTRAVENOUS

## 2018-03-22 MED ORDER — SODIUM CHLORIDE (PF) 0.9 % IJ SOLN
INTRAMUSCULAR | Status: AC
  Filled 2018-03-22: qty 50

## 2018-03-22 MED ORDER — PHENYLEPHRINE HCL 10 MG/ML IJ SOLN
INTRAMUSCULAR | Status: DC | PRN
  Administered 2018-03-22: 40 ug via INTRAVENOUS

## 2018-03-22 MED ORDER — METOCLOPRAMIDE HCL 5 MG/ML IJ SOLN
INTRAMUSCULAR | Status: DC | PRN
  Administered 2018-03-22: 10 mg via INTRAVENOUS

## 2018-03-22 MED ORDER — LIDOCAINE HCL (CARDIAC) PF 100 MG/5ML IV SOSY
PREFILLED_SYRINGE | INTRAVENOUS | Status: DC | PRN
  Administered 2018-03-22: 60 mg via INTRAVENOUS

## 2018-03-22 MED ORDER — ROCURONIUM BROMIDE 10 MG/ML (PF) SYRINGE
PREFILLED_SYRINGE | INTRAVENOUS | Status: AC
  Filled 2018-03-22: qty 10

## 2018-03-22 MED ORDER — LABETALOL HCL 5 MG/ML IV SOLN
INTRAVENOUS | Status: AC
  Filled 2018-03-22: qty 4

## 2018-03-22 MED ORDER — ROCURONIUM BROMIDE 100 MG/10ML IV SOLN
INTRAVENOUS | Status: DC | PRN
  Administered 2018-03-22: 50 mg via INTRAVENOUS
  Administered 2018-03-22: 10 mg via INTRAVENOUS
  Administered 2018-03-22: 20 mg via INTRAVENOUS

## 2018-03-22 MED ORDER — SODIUM CHLORIDE 0.9 % IR SOLN
Status: DC | PRN
  Administered 2018-03-22: 1000 mL

## 2018-03-22 MED ORDER — ONDANSETRON HCL 4 MG/2ML IJ SOLN
INTRAMUSCULAR | Status: AC
  Filled 2018-03-22: qty 2

## 2018-03-22 MED ORDER — LACTATED RINGERS IV SOLN
INTRAVENOUS | Status: DC | PRN
  Administered 2018-03-22: 10:00:00 via INTRAVENOUS

## 2018-03-22 MED ORDER — LIDOCAINE 2% (20 MG/ML) 5 ML SYRINGE
INTRAMUSCULAR | Status: AC
  Filled 2018-03-22: qty 5

## 2018-03-22 MED ORDER — LABETALOL HCL 5 MG/ML IV SOLN
INTRAVENOUS | Status: DC | PRN
  Administered 2018-03-22: 5 mg via INTRAVENOUS

## 2018-03-22 MED ORDER — BUPIVACAINE HCL (PF) 0.25 % IJ SOLN
INTRAMUSCULAR | Status: DC | PRN
  Administered 2018-03-22: 10 mL

## 2018-03-22 MED ORDER — ONDANSETRON HCL 4 MG/2ML IJ SOLN
4.0000 mg | Freq: Four times a day (QID) | INTRAMUSCULAR | Status: DC | PRN
  Administered 2018-03-22: 4 mg via INTRAVENOUS
  Filled 2018-03-22: qty 2

## 2018-03-22 MED ORDER — MENTHOL 3 MG MT LOZG: 1.0000 | LOZENGE | OROMUCOSAL | Status: DC | PRN

## 2018-03-22 MED ORDER — CEFAZOLIN SODIUM-DEXTROSE 2-4 GM/100ML-% IV SOLN
2.0000 g | INTRAVENOUS | Status: AC
  Administered 2018-03-22: 2 g via INTRAVENOUS
  Filled 2018-03-22: qty 100

## 2018-03-22 MED ORDER — BUPIVACAINE HCL (PF) 0.25 % IJ SOLN
INTRAMUSCULAR | Status: AC
  Filled 2018-03-22: qty 30

## 2018-03-22 MED ORDER — VALACYCLOVIR HCL 500 MG PO TABS
1000.0000 mg | ORAL_TABLET | Freq: Every day | ORAL | Status: DC
  Filled 2018-03-22: qty 2

## 2018-03-22 MED ORDER — SUGAMMADEX SODIUM 200 MG/2ML IV SOLN
INTRAVENOUS | Status: DC | PRN
  Administered 2018-03-22: 200 mg via INTRAVENOUS

## 2018-03-22 MED ORDER — SIMETHICONE 80 MG PO CHEW
80.0000 mg | CHEWABLE_TABLET | Freq: Four times a day (QID) | ORAL | Status: DC | PRN
  Administered 2018-03-22: 80 mg via ORAL
  Filled 2018-03-22 (×2): qty 1

## 2018-03-22 MED ORDER — DEXAMETHASONE SODIUM PHOSPHATE 10 MG/ML IJ SOLN
INTRAMUSCULAR | Status: AC
  Filled 2018-03-22: qty 1

## 2018-03-22 MED ORDER — ROPIVACAINE HCL 5 MG/ML IJ SOLN
INTRAMUSCULAR | Status: AC
  Filled 2018-03-22: qty 30

## 2018-03-22 MED ORDER — METOCLOPRAMIDE HCL 5 MG/ML IJ SOLN
INTRAMUSCULAR | Status: AC
  Filled 2018-03-22: qty 2

## 2018-03-22 MED ORDER — FENTANYL CITRATE (PF) 100 MCG/2ML IJ SOLN: 25.0000 ug | INTRAMUSCULAR | Status: DC | PRN

## 2018-03-22 MED ORDER — FENTANYL CITRATE (PF) 250 MCG/5ML IJ SOLN
INTRAMUSCULAR | Status: AC
  Filled 2018-03-22: qty 5

## 2018-03-22 MED ORDER — MIDAZOLAM HCL 2 MG/2ML IJ SOLN
INTRAMUSCULAR | Status: AC
  Filled 2018-03-22: qty 2

## 2018-03-22 SURGICAL SUPPLY — 57 items
APPLICATOR ARISTA FLEXITIP XL (MISCELLANEOUS) IMPLANT
BARRIER ADHS 3X4 INTERCEED (GAUZE/BANDAGES/DRESSINGS) IMPLANT
CANISTER SUCT 3000ML PPV (MISCELLANEOUS) ×3 IMPLANT
CATH FOLEY 3WAY  5CC 16FR (CATHETERS) ×2
CATH FOLEY 3WAY 5CC 16FR (CATHETERS) ×1 IMPLANT
COVER BACK TABLE 60X90IN (DRAPES) ×3 IMPLANT
COVER TIP SHEARS 8 DVNC (MISCELLANEOUS) ×1 IMPLANT
COVER TIP SHEARS 8MM DA VINCI (MISCELLANEOUS) ×2
DECANTER SPIKE VIAL GLASS SM (MISCELLANEOUS) ×6 IMPLANT
DEFOGGER SCOPE WARMER CLEARIFY (MISCELLANEOUS) ×3 IMPLANT
DERMABOND ADVANCED (GAUZE/BANDAGES/DRESSINGS) ×2
DERMABOND ADVANCED .7 DNX12 (GAUZE/BANDAGES/DRESSINGS) ×1 IMPLANT
DRAPE ARM DVNC X/XI (DISPOSABLE) ×4 IMPLANT
DRAPE COLUMN DVNC XI (DISPOSABLE) ×1 IMPLANT
DRAPE DA VINCI XI ARM (DISPOSABLE) ×8
DRAPE DA VINCI XI COLUMN (DISPOSABLE) ×2
DURAPREP 26ML APPLICATOR (WOUND CARE) ×3 IMPLANT
ELECT REM PT RETURN 9FT ADLT (ELECTROSURGICAL) ×3
ELECTRODE REM PT RTRN 9FT ADLT (ELECTROSURGICAL) ×1 IMPLANT
GLOVE BIOGEL PI IND STRL 7.0 (GLOVE) ×5 IMPLANT
GLOVE BIOGEL PI INDICATOR 7.0 (GLOVE) ×10
GLOVE ECLIPSE 6.5 STRL STRAW (GLOVE) ×9 IMPLANT
HEMOSTAT ARISTA ABSORB 3G PWDR (MISCELLANEOUS) IMPLANT
IRRIG SUCT STRYKERFLOW 2 WTIP (MISCELLANEOUS) ×3
IRRIGATION SUCT STRKRFLW 2 WTP (MISCELLANEOUS) ×1 IMPLANT
LEGGING LITHOTOMY PAIR STRL (DRAPES) ×3 IMPLANT
NEEDLE INSUFFLATION 120MM (ENDOMECHANICALS) ×3 IMPLANT
NEEDLE INSUFFLATION 150MM (ENDOMECHANICALS) IMPLANT
OBTURATOR OPTICAL STANDARD 8MM (TROCAR)
OBTURATOR OPTICAL STND 8 DVNC (TROCAR)
OBTURATOR OPTICALSTD 8 DVNC (TROCAR) IMPLANT
OCCLUDER COLPOPNEUMO (BALLOONS) ×3 IMPLANT
PACK ROBOT WH (CUSTOM PROCEDURE TRAY) ×3 IMPLANT
PACK ROBOTIC GOWN (GOWN DISPOSABLE) ×3 IMPLANT
PACK TRENDGUARD 450 HYBRID PRO (MISCELLANEOUS) IMPLANT
PAD PREP 24X48 CUFFED NSTRL (MISCELLANEOUS) ×3 IMPLANT
PROTECTOR NERVE ULNAR (MISCELLANEOUS) ×6 IMPLANT
SEAL CANN UNIV 5-8 DVNC XI (MISCELLANEOUS) ×3 IMPLANT
SEAL XI 5MM-8MM UNIVERSAL (MISCELLANEOUS) ×6
SEALER VESSEL DA VINCI XI (MISCELLANEOUS) ×2
SEALER VESSEL EXT DVNC XI (MISCELLANEOUS) ×1 IMPLANT
SET CYSTO W/LG BORE CLAMP LF (SET/KITS/TRAYS/PACK) IMPLANT
SET TRI-LUMEN FLTR TB AIRSEAL (TUBING) ×3 IMPLANT
SUT VIC AB 0 CT1 36 (SUTURE) ×6 IMPLANT
SUT VICRYL 0 UR6 27IN ABS (SUTURE) IMPLANT
SUT VICRYL 4-0 PS2 18IN ABS (SUTURE) ×6 IMPLANT
SUT VLOC 180 0 9IN  GS21 (SUTURE) ×2
SUT VLOC 180 0 9IN GS21 (SUTURE) ×1 IMPLANT
TIP RUMI ORANGE 6.7MMX12CM (TIP) IMPLANT
TIP UTERINE 5.1X6CM LAV DISP (MISCELLANEOUS) IMPLANT
TIP UTERINE 6.7X10CM GRN DISP (MISCELLANEOUS) ×3 IMPLANT
TIP UTERINE 6.7X6CM WHT DISP (MISCELLANEOUS) IMPLANT
TIP UTERINE 6.7X8CM BLUE DISP (MISCELLANEOUS) IMPLANT
TOWEL OR 17X24 6PK STRL BLUE (TOWEL DISPOSABLE) ×6 IMPLANT
TRENDGUARD 450 HYBRID PRO PACK (MISCELLANEOUS)
TROCAR PORT AIRSEAL 8X120 (TROCAR) ×3 IMPLANT
WATER STERILE IRR 1000ML POUR (IV SOLUTION) ×3 IMPLANT

## 2018-03-22 NOTE — Anesthesia Preprocedure Evaluation (Addendum)
Anesthesia Evaluation  Patient identified by MRN, date of birth, ID band Patient awake    Reviewed: Allergy & Precautions, NPO status , Patient's Chart, lab work & pertinent test results  Airway Mallampati: I  TM Distance: >3 FB Neck ROM: Full    Dental no notable dental hx. (+) Teeth Intact, Dental Advisory Given   Pulmonary neg pulmonary ROS,    Pulmonary exam normal breath sounds clear to auscultation       Cardiovascular negative cardio ROS Normal cardiovascular exam Rhythm:Regular Rate:Normal     Neuro/Psych negative neurological ROS  negative psych ROS   GI/Hepatic Neg liver ROS, GERD  ,  Endo/Other  negative endocrine ROS  Renal/GU negative Renal ROS  negative genitourinary   Musculoskeletal negative musculoskeletal ROS (+)   Abdominal   Peds  Hematology negative hematology ROS (+)   Anesthesia Other Findings Uterine fibroid  Reproductive/Obstetrics                            Anesthesia Physical Anesthesia Plan  ASA: II  Anesthesia Plan: General   Post-op Pain Management:    Induction: Intravenous  PONV Risk Score and Plan: 3  Airway Management Planned: Oral ETT  Additional Equipment:   Intra-op Plan:   Post-operative Plan: Extubation in OR  Informed Consent: I have reviewed the patients History and Physical, chart, labs and discussed the procedure including the risks, benefits and alternatives for the proposed anesthesia with the patient or authorized representative who has indicated his/her understanding and acceptance.   Dental advisory given  Plan Discussed with: CRNA  Anesthesia Plan Comments:         Anesthesia Quick Evaluation

## 2018-03-22 NOTE — Anesthesia Procedure Notes (Signed)
Procedure Name: Intubation Date/Time: 03/22/2018 11:00 AM Performed by: Justice Rocher, CRNA Pre-anesthesia Checklist: Patient identified, Emergency Drugs available, Suction available and Patient being monitored Patient Re-evaluated:Patient Re-evaluated prior to induction Oxygen Delivery Method: Circle system utilized Preoxygenation: Pre-oxygenation with 100% oxygen Induction Type: IV induction Ventilation: Mask ventilation without difficulty Laryngoscope Size: Mac and 3 Grade View: Grade I Tube type: Oral Tube size: 7.0 mm Number of attempts: 1 Airway Equipment and Method: Stylet and Oral airway Placement Confirmation: ETT inserted through vocal cords under direct vision,  positive ETCO2 and breath sounds checked- equal and bilateral Secured at: 22 cm Tube secured with: Tape Dental Injury: Teeth and Oropharynx as per pre-operative assessment

## 2018-03-22 NOTE — Transfer of Care (Signed)
Immediate Anesthesia Transfer of Care Note  Patient: Misty Nunez  Procedure(s) Performed: Procedure(s) (LRB): XI ROBOTIC ASSISTED LAPAROSCOPIC HYSTERECTOMY AND SALPINGECTOMY (Bilateral)  Patient Location: PACU  Anesthesia Type: General  Level of Consciousness: awake, sedated, patient cooperative and responds to stimulation  Airway & Oxygen Therapy: Patient Spontanous Breathing and Patient connected to face mask oxygen  Post-op Assessment: Report given to PACU RN, Post -op Vital signs reviewed and stable and Patient moving all extremities  Post vital signs: Reviewed and stable  Complications: No apparent anesthesia complications

## 2018-03-22 NOTE — Brief Op Note (Signed)
03/22/2018  1:58 PM  PATIENT:  Misty Nunez  52 y.o. female  PRE-OPERATIVE DIAGNOSIS:  Symptomatic Uterine Fibroid  POST-OPERATIVE DIAGNOSIS:  Symptomatic Uterine Fibroid  PROCEDURE:  15 XI robotic total hysterectomy, bilateral salpingectomy  SURGEON:  Surgeon(s) and Role:    * Servando Salina, MD - Primary  PHYSICIAN ASSISTANT:   ASSISTANTS: Artelia Laroche, CNM   ANESTHESIA:   general Findings: nl ovaries, fibroid uterus, hydrosalpinges bilaterally, appendix with adhesion and wrapped with adhesion from right tube and omentum EBL:  20 mL   BLOOD ADMINISTERED:none  DRAINS: none   LOCAL MEDICATIONS USED:  MARCAINE     SPECIMEN:  Source of Specimen:  uterus with cervix, tubes  DISPOSITION OF SPECIMEN:  PATHOLOGY  COUNTS:  YES  TOURNIQUET:  * No tourniquets in log *  DICTATION: .Other Dictation: Dictation Number [id number]  PLAN OF CARE: Admit for overnight observation  PATIENT DISPOSITION:  PACU - hemodynamically stable.   Delay start of Pharmacological VTE agent (>24hrs) due to surgical blood loss or risk of bleeding: no

## 2018-03-23 ENCOUNTER — Encounter (HOSPITAL_BASED_OUTPATIENT_CLINIC_OR_DEPARTMENT_OTHER): Payer: Self-pay | Admitting: Obstetrics and Gynecology

## 2018-03-23 DIAGNOSIS — R87810 Cervical high risk human papillomavirus (HPV) DNA test positive: Secondary | ICD-10-CM | POA: Diagnosis not present

## 2018-03-23 DIAGNOSIS — N736 Female pelvic peritoneal adhesions (postinfective): Secondary | ICD-10-CM | POA: Diagnosis not present

## 2018-03-23 DIAGNOSIS — D259 Leiomyoma of uterus, unspecified: Secondary | ICD-10-CM | POA: Diagnosis not present

## 2018-03-23 DIAGNOSIS — Z79899 Other long term (current) drug therapy: Secondary | ICD-10-CM | POA: Diagnosis not present

## 2018-03-23 DIAGNOSIS — N83201 Unspecified ovarian cyst, right side: Secondary | ICD-10-CM | POA: Diagnosis not present

## 2018-03-23 DIAGNOSIS — N7011 Chronic salpingitis: Secondary | ICD-10-CM | POA: Diagnosis not present

## 2018-03-23 DIAGNOSIS — Z9851 Tubal ligation status: Secondary | ICD-10-CM | POA: Diagnosis not present

## 2018-03-23 DIAGNOSIS — D509 Iron deficiency anemia, unspecified: Secondary | ICD-10-CM | POA: Diagnosis not present

## 2018-03-23 DIAGNOSIS — N840 Polyp of corpus uteri: Secondary | ICD-10-CM | POA: Diagnosis not present

## 2018-03-23 LAB — BASIC METABOLIC PANEL
Anion gap: 10 (ref 5–15)
BUN: 7 mg/dL (ref 6–20)
CHLORIDE: 104 mmol/L (ref 98–111)
CO2: 24 mmol/L (ref 22–32)
Calcium: 8.5 mg/dL — ABNORMAL LOW (ref 8.9–10.3)
Creatinine, Ser: 0.67 mg/dL (ref 0.44–1.00)
GFR calc Af Amer: >60 mL/min (ref >60)
GFR calc non Af Amer: >60 mL/min (ref >60)
Glucose, Bld: 153 mg/dL — ABNORMAL HIGH (ref 70–99)
Potassium: 4.2 mmol/L (ref 3.5–5.1)
Sodium: 138 mmol/L (ref 135–145)

## 2018-03-23 LAB — CBC
HCT: 39.1 % (ref 36.0–46.0)
Hemoglobin: 12.4 g/dL (ref 12.0–15.0)
MCH: 30 pg (ref 26.0–34.0)
MCHC: 31.7 g/dL (ref 30.0–36.0)
MCV: 94.4 fL (ref 80.0–100.0)
Platelets: 267 10*3/uL (ref 150–400)
RBC: 4.14 MIL/uL (ref 3.87–5.11)
RDW: 12.5 % (ref 11.5–15.5)
WBC: 16.4 10*3/uL — ABNORMAL HIGH (ref 4.0–10.5)
nRBC: 0 % (ref 0.0–0.2)

## 2018-03-23 MED ORDER — IBUPROFEN 800 MG PO TABS: 800.0000 mg | ORAL_TABLET | Freq: Three times a day (TID) | ORAL | 4 refills | Status: DC | PRN

## 2018-03-23 MED ORDER — ONDANSETRON HCL 4 MG PO TABS: 4.0000 mg | ORAL_TABLET | Freq: Four times a day (QID) | ORAL | 0 refills | Status: DC | PRN

## 2018-03-23 MED ORDER — OXYCODONE HCL 5 MG PO TABS: 5.0000 mg | ORAL_TABLET | ORAL | 0 refills | Status: DC | PRN

## 2018-03-23 MED FILL — IBUPROFEN 800 MG TAB: 800 | 10 days supply | Qty: 30 | Fill #0

## 2018-03-23 MED FILL — ONDANSETRON HCL 4 MG TABLET: 4 | 3 days supply | Qty: 20 | Fill #0

## 2018-03-23 MED FILL — oxyCODONE HCL 5 MG TABS: 5 | 2 days supply | Qty: 30 | Fill #0

## 2018-03-23 NOTE — Discharge Summary (Signed)
Physician Discharge Summary  Patient ID: Misty Nunez MRN: [id number] DOB/AGE: [birth year] 52 y.o.  Admit date: 03/22/2018 Discharge date: 03/23/2018  Admission Diagnoses: symptomatic uterine fibroids  Discharge Diagnoses: symptomatic uterine fibroids Active Problems:   Status post total hysterectomy   Discharged Condition: stable  Hospital Course: pt underwent Da vinci robotic total hysterectomy, bilateral salpingectomy. Uncomplicated postoperative course  Consults: None  Significant Diagnostic Studies: labs:  CBC    Component Value Date/Time   WBC 16.4 (H) 03/23/2018 0505   RBC 4.14 03/23/2018 0505   HGB 12.4 03/23/2018 0505   HCT 39.1 03/23/2018 0505   PLT 267 03/23/2018 0505   MCV 94.4 03/23/2018 0505   MCH 30.0 03/23/2018 0505   MCHC 31.7 03/23/2018 0505   RDW 12.5 03/23/2018 0505   LYMPHSABS 1.4 05/01/2010 1040   MONOABS 0.4 05/01/2010 1040   EOSABS 0.1 05/01/2010 1040   BASOSABS 0.0 05/01/2010 1040   CMP Latest Ref Rng & Units 03/23/2018 03/07/2018 05/01/2010  Glucose 70 - 99 mg/dL 153(H) 84 88  BUN 6 - 20 mg/dL 7 13 7   Creatinine 0.44 - 1.00 mg/dL 0.67 0.61 0.82  Sodium 135 - 145 mmol/L 138 141 141  Potassium 3.5 - 5.1 mmol/L 4.2 3.8 3.9  Chloride 98 - 111 mmol/L 104 108 104  CO2 22 - 32 mmol/L 24 29 27   Calcium 8.9 - 10.3 mg/dL 8.5(L) 9.1 9.7   Treatments: surgery: Da vinci robotic total hysterectomy, bilateral salpingectomy  Discharge Exam: Blood pressure 119/77, pulse 98, temperature 97.8 F (36.6 C), resp. rate 16, height 5\' 8"  (1.727 m), weight 65.7 kg, last menstrual period 03/12/2018, SpO2 100 %. General appearance: alert, cooperative and no distress Back: see postop note Resp: clear to auscultation bilaterally Cardio: regular rate and rhythm, S1, S2 normal, no murmur, click, rub or gallop GI: soft non distended. incision d/c/i Pelvic: deferred Extremities: no edema, redness or tenderness in the calves or thighs  Pad just changed  Disposition:  Discharge disposition: 01-Home or Self Care       Discharge Instructions    Call MD for:  persistant nausea and vomiting   Complete by:  As directed    Call MD for:  redness, tenderness, or signs of infection (pain, swelling, redness, odor or green/yellow discharge around incision site)   Complete by:  As directed    Call MD for:  severe uncontrolled pain   Complete by:  As directed    Call MD for:  temperature >100.4   Complete by:  As directed    Diet general   Complete by:  As directed    Discharge instructions   Complete by:  As directed    Gas pain. Warm heat to abdomen every 4 hours x 24 hrs. ambulate   May walk up steps   Complete by:  As directed      Allergies as of 03/23/2018      Reactions   Flagyl [metronidazole] Hives      Medication List    TAKE these medications   [employer] STOOL SOFTENER 100 MG capsule Generic drug:  docusate sodium TAKE 1 CAPSULE BY MOUTH TWICE DAILY What changed:    how much to take  when to take this  reasons to take this   ferrous sulfate 325 (65 FE) MG tablet Take 325 mg by mouth daily with breakfast.   ibuprofen 800 MG tablet Commonly known as:  ADVIL,MOTRIN Take 1 tablet (800 mg total) by mouth every 8 (eight) hours as needed.  MULTIVITAMIN GUMMIES ADULT PO Take 2 each by mouth daily.   omeprazole 40 MG capsule Commonly known as:  PRILOSEC Take 40 mg by mouth daily.   ondansetron 4 MG tablet Commonly known as:  ZOFRAN Take 1 tablet (4 mg total) by mouth every 6 (six) hours as needed for nausea.   OSTEO BI-FLEX ADV JOINT SHIELD PO Take 2 tablets by mouth daily.   oxyCODONE 5 MG immediate release tablet Commonly known as:  Oxy IR/ROXICODONE Take 1-2 tablets (5-10 mg total) by mouth every 4 (four) hours as needed for moderate pain.   valACYclovir 1000 MG tablet Commonly known as:  VALTREX Take 1,000 mg by mouth daily.   VISINE 0.05 % ophthalmic solution Generic drug:  tetrahydrozoline Place 1 drop into both eyes  daily as needed. For dry eyes   vitamin B-12 1000 MCG tablet Commonly known as:  CYANOCOBALAMIN Take 1,000 mcg by mouth daily.      Follow-up Information    Servando Salina, MD Follow up in 2 week(s).   Specialty:  Obstetrics and Gynecology Contact information: [address] [place] [address] [phone]           Signed: Alanda Slim A Javohn Basey 03/23/2018, 7:50 AM

## 2018-03-23 NOTE — Anesthesia Postprocedure Evaluation (Signed)
Anesthesia Post Note  Patient: Misty Nunez  Procedure(s) Performed: XI ROBOTIC ASSISTED LAPAROSCOPIC HYSTERECTOMY AND SALPINGECTOMY (Bilateral Abdomen)     Patient location during evaluation: PACU Anesthesia Type: General Level of consciousness: awake and alert Pain management: pain level controlled Vital Signs Assessment: post-procedure vital signs reviewed and stable Respiratory status: spontaneous breathing, nonlabored ventilation, respiratory function stable and patient connected to nasal cannula oxygen Cardiovascular status: blood pressure returned to baseline and stable Postop Assessment: no apparent nausea or vomiting Anesthetic complications: no    Last Vitals:  Vitals:   03/22/18 2325 03/23/18 0400  BP: 105/73 119/77  Pulse: 99 98  Resp: 16 16  Temp: 36.8 C 36.6 C  SpO2: 98% 100%    Last Pain:  Vitals:   03/23/18 0400  TempSrc:   PainSc: 2                  Misty Nunez

## 2018-03-23 NOTE — Discharge Instructions (Signed)
Call if temperature greater than equal to 100.4, nothing per vagina for 4-6 weeks or severe nausea vomiting, increased incisional pain , drainage or redness in the incision site, no straining with bowel movements, showers no bath °

## 2018-03-23 NOTE — Op Note (Signed)
NAME: Misty Nunez, Misty Nunez MEDICAL RECORD BO:[id number] ACCOUNT [id number] DATE OF BIRTH:[birth year] FACILITY: [facility] LOCATION: [facility] PHYSICIAN:Crista Nuon A. Zhanae Proffit, MD  OPERATIVE REPORT  DATE OF PROCEDURE:  03/22/2018  PREOPERATIVE DIAGNOSIS:  Symptomatic uterine fibroids.  PROCEDURE:  Da Vinci Mechele Claude robotic total abdominal hysterectomy, bilateral salpingectomy.  POSTOPERATIVE DIAGNOSIS:  Symptomatic uterine fibroids.  ANESTHESIA:  General.  SURGEON:  Servando Salina, MD  ASSISTANT:  Artelia Laroche, CNM.  DESCRIPTION OF PROCEDURE:  Under adequate general anesthesia, the patient was placed in the dorsal lithotomy position.  She was sterilely prepped and draped in the usual fashion.  An indwelling 3-way Foley catheter was sterilely placed.  Examination under anesthesia revealed a 12-13 week size uterus.  No adnexal masses could be appreciated.  A weighted speculum was placed in the vagina.  Sims retractor was placed anteriorly.  A bulbous cervix was noted.  Zero Vicryl figure-of-eight sutures placed on  the anterior and posterior lip of the cervix.  The uterus sounded to 10 cm.  A large RUMI cup with a #10 uterine manipulator was introduced into the uterine cavity and the retractors were removed.  Attention was then turned to the abdomen.  Previous small infraumbilical scar was noted.  Marcaine 0.25% was injected through the scars.  Incision was placed through the previous scar.  Veress needle was introduced, tested with sterile water.  Opening pressure of 4 was noted.  Then, 2.5 liters of CO2 was  insufflated.  Veress needle was then removed.  A robotic port was then placed and the robotic camera was inserted.  Inspection was noted.  Entry into the abdomen without incident.  The patient was placed in the Trendelenburg position.  Two robotic ports  sites was placed, one the right and one on the left and intervening AirSeal was placed on the left.  The robot was then docked.  In arm #1 was a uterine  vessel sealer.  In arm #4 monopolar scissors.  In arm #3 was the bipolar forceps.  I then went to thesurgical console.  At the surgical console, the pelvis was inspected.  Ureters were identified bilaterally.  The right ureter was a little bit lateral.  There was evidence of hydrosalpinges on both tubes, right greater than left.  A distal portion of the fallopian tube had adhesions from the omentum through which the appendix had wrapped around that adhesion.  The appendix was unwrapped and the adhesions lysed.  The procedure was then started on the left side.  The left retroperitoneal space was opened.  The underlying mesosalpinx of the fallopian tube was serially clamped, cauterized and cut and the tube removed.  The proximal left uteroovarian ligament was serially clamped, cauterized and then cut.  The left round ligament was clamped,  cauterized, and cut.  The vesicouterine peritoneum was opened anteriorly and displaced inferiorly.  The posterior broad ligament was also dissected down to displace the ureter further inferiorly.  Uterine vessels were prominent on the left, tortuous and were skeletonized, serially clamped, cauterized and then cut.  Same procedure was performed on the contralateral side with removal of the right tube opening of the retroperitoneal space on the right, identifying the ureter, clamping the right ovarian  ligament, clamped, cauterized and cutting.  The remaining vesicouterine peritoneum was opened and completed and the bladder sharply displaced inferiorly.  The uterine vessels were serially clamped, cauterized, and cut.  The vaginal insufflator was used.   The cervicovaginal junction was opened with the scissors. With repeat cauterization of the uterine vessels  bilaterally, circumferential removal of the cervix from the vagina was then done.  The uterus was then removed through the vagina.  Vaginal cuff bleeders were cauterized.  The bladder was further displaced inferiorly.  The  V-Loc suture was inserted in the abdomen and arm #1 and arm #4 instruments were replaced by a long tip forceps and the large mega suture needle driver respectively.  Vaginal  cuff was then closed with 0 V-Loc x2.  Digital palpation of the vaginal cuff shows good approximation.  The pressure was deflated.  The abdomen was irrigated and suctioned of debris.  Good hemostasis was noted.  Pedicles were noted to have good hemostasis and Arista potato starch was placed over the vaginal cuff.  The procedure was then complete, at which time the robotic instruments were removed.  The robot was undocked.  I went back to the patient's bedside sterilely with the robotic ports then removed and abdomen deflated and the AirSeal removed.  Incision was then closed with 4-0 Vicryl subcuticular closures.  Reinspection of the vagina showed good approximation as well.  SPECIMEN:  Uterus with cervix and tubes, weighing 387 grams.  ESTIMATED BLOOD LOSS:  20 mL  URINE OUTPUT:  100 mL  INTRAOPERATIVE FLUIDS:  600 mL  COUNTS:  Sponge and instrument counts x2 was correct.  COMPLICATIONS:  None.  The patient tolerated the procedure well and was transferred to recovery room in stable condition.  TN/NUANCE  D:03/22/2018 T:03/23/2018 JOB:[id number]

## 2018-03-23 NOTE — Progress Notes (Signed)
Subjective: Patient reports nausea, tolerating PO and no problems voiding c/o right back pain radiating to right front and neck Void large amount of urine.    Objective: I have reviewed patient's vital signs.  vital signs, intake and output and labs. Vitals:   03/22/18 2325 03/23/18 0400  BP: 105/73 119/77  Pulse: 99 98  Resp: 16 16  Temp: 98.2 F (36.8 C) 97.8 F (36.6 C)  SpO2: 98% 100%   I/O last 3 completed shifts: In: 2590 [P.O.:240; I.V.:2350] Out: 1308 [Urine:1750; Blood:20] No intake/output data recorded.  Lab Results  Component Value Date   WBC 16.4 (H) 03/23/2018   HGB 12.4 03/23/2018   HCT 39.1 03/23/2018   MCV 94.4 03/23/2018   PLT 267 03/23/2018   Lab Results  Component Value Date   CREATININE 0.67 03/23/2018    EXAM General: alert, cooperative and no distress Resp: clear to auscultation bilaterally Cardio: regular rate and rhythm, S1, S2 normal, no murmur, click, rub or gallop GI: soft, non-tender; bowel sounds normal; no masses,  no organomegaly and incision: clean, dry and intact Extremities: no edema, redness or tenderness in the calves or thighs Vaginal Bleeding: none Back (+)right cvat Assessment: s/p Procedure(s): XI ROBOTIC ASSISTED LAPAROSCOPIC HYSTERECTOMY AND SALPINGECTOMY: stable, progressing well and tolerating diet.  Symptoms related gas pain from the surgery not ureteral issue. Large urinary voids  Plan: Advance diet Encourage ambulation Discontinue IV fluids Discharge home  Bladder scan for postvoid residual Pt to eat prior to d/c D/c instructions reviewed.  F/U 2 WKS Disc mgmt of gas> heating pad to abdomen and ambulate  LOS: 0 days    Marvene Staff, MD 03/23/2018 7:47 AM    03/23/2018, 7:47 AM

## 2018-04-04 DIAGNOSIS — Z09 Encounter for follow-up examination after completed treatment for conditions other than malignant neoplasm: Secondary | ICD-10-CM | POA: Diagnosis not present

## 2018-04-16 MED FILL — valACYclovir HCL 1 GM TABS: 1 | 30 days supply | Qty: 30 | Fill #4

## 2018-04-27 DIAGNOSIS — N760 Acute vaginitis: Secondary | ICD-10-CM | POA: Diagnosis not present

## 2018-04-27 MED FILL — CLINDAMYCIN HCL 300 MG CAP: 300 | 7 days supply | Qty: 14 | Fill #0

## 2018-05-01 DIAGNOSIS — G8918 Other acute postprocedural pain: Secondary | ICD-10-CM | POA: Diagnosis not present

## 2018-05-02 DIAGNOSIS — M25521 Pain in right elbow: Secondary | ICD-10-CM | POA: Diagnosis not present

## 2018-05-02 DIAGNOSIS — M7711 Lateral epicondylitis, right elbow: Secondary | ICD-10-CM | POA: Diagnosis not present

## 2018-05-09 DIAGNOSIS — M25521 Pain in right elbow: Secondary | ICD-10-CM | POA: Diagnosis not present

## 2018-05-16 DIAGNOSIS — M7711 Lateral epicondylitis, right elbow: Secondary | ICD-10-CM | POA: Diagnosis not present

## 2018-05-31 MED FILL — valACYclovir HCL 1 GM TABS: 1 | 30 days supply | Qty: 30 | Fill #5

## 2018-06-19 DIAGNOSIS — Z0000 Encounter for general adult medical examination without abnormal findings: Secondary | ICD-10-CM | POA: Diagnosis not present

## 2018-06-19 DIAGNOSIS — R739 Hyperglycemia, unspecified: Secondary | ICD-10-CM | POA: Diagnosis not present

## 2018-06-19 DIAGNOSIS — Z5181 Encounter for therapeutic drug level monitoring: Secondary | ICD-10-CM | POA: Diagnosis not present

## 2018-06-19 DIAGNOSIS — E782 Mixed hyperlipidemia: Secondary | ICD-10-CM | POA: Diagnosis not present

## 2018-06-19 DIAGNOSIS — E611 Iron deficiency: Secondary | ICD-10-CM | POA: Diagnosis not present

## 2018-07-09 MED FILL — valACYclovir HCL 1 GM TABS: 1 | 90 days supply | Qty: 90 | Fill #0

## 2018-09-05 MED FILL — MELOXICAM 15 MG TABLET: 15 | 90 days supply | Qty: 90 | Fill #2

## 2018-09-05 MED FILL — IBUPROFEN 800 MG TAB: 800 | 10 days supply | Qty: 30 | Fill #1

## 2018-10-11 MED FILL — valACYclovir HCL 1 GM TABS: 1 | 90 days supply | Qty: 90 | Fill #1

## 2018-10-26 MED FILL — BETAMETHASONE DP 0.05% CRM: 0.05 | 30 days supply | Qty: 60 | Fill #0

## 2018-11-15 MED FILL — BETAMETHASONE DP 0.05% CRM: 0.05 | 30 days supply | Qty: 60 | Fill #0

## 2019-01-14 MED FILL — valACYclovir HCL 1 GM TABS: 1 | 90 days supply | Qty: 90 | Fill #2

## 2019-02-14 MED FILL — MELOXICAM 15 MG TABLET: 15 | 90 days supply | Qty: 90 | Fill #0

## 2019-04-18 ENCOUNTER — Ambulatory Visit: Payer: 59 | Attending: Internal Medicine

## 2019-04-18 DIAGNOSIS — Z20828 Contact with and (suspected) exposure to other viral communicable diseases: Secondary | ICD-10-CM | POA: Diagnosis not present

## 2019-04-18 DIAGNOSIS — Z20822 Contact with and (suspected) exposure to covid-19: Secondary | ICD-10-CM

## 2019-04-20 LAB — NOVEL CORONAVIRUS, NAA: SARS-CoV-2, NAA: NOT DETECTED

## 2019-05-03 MED FILL — valACYclovir HCL 1 GM TABS: 1 | 30 days supply | Qty: 30 | Fill #3

## 2019-05-19 IMAGING — MR MR PELVIS WO/W CM
11 of 14 series · 37 of 48 positions shown · IV contrast (13 ml multihance)
Comparison: Pelvic ultrasound on 08/08/2017 and noncontrast CT on
07/18/2017

CLINICAL DATA: Diffuse pelvic pain. Fibroids. Right ovarian cystic
lesion on recent ultrasound.

EXAM:
MRI PELVIS WITHOUT AND WITH CONTRAST
TECHNIQUE: Multiplanar multisequence MR imaging of the pelvis was performed
both before and after administration of intravenous contrast.
CONTRAST:  13mL MULTIHANCE GADOBENATE DIMEGLUMINE 529 MG/ML IV SOLN

[Series 2: cor haste cover · coronal · 6.0mm · 0.74mm/px · 3 of 29 slices shown]
[im 1/29]
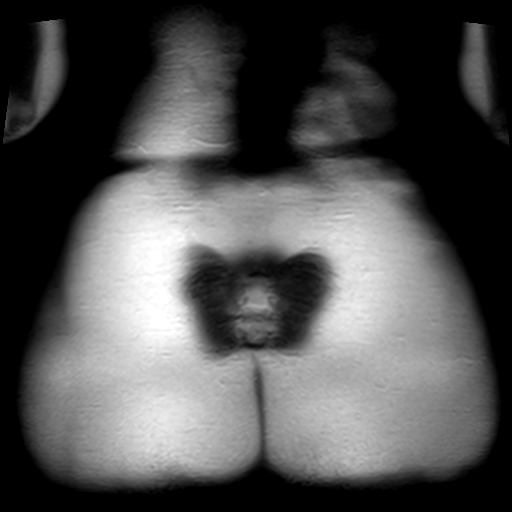
[im 15/29]
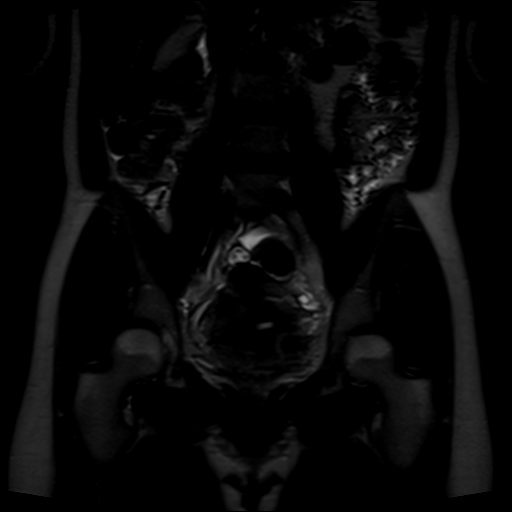
[im 29/29]
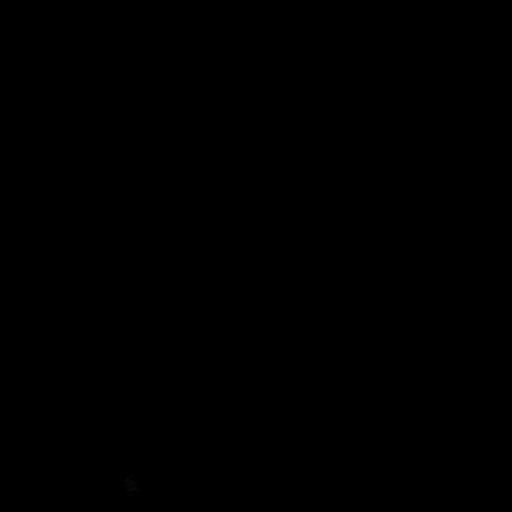

[Series 3: t2_tse_sag · sagittal · 5.0mm · 1.05mm/px · 3 of 27 slices shown]
[im 1/27]
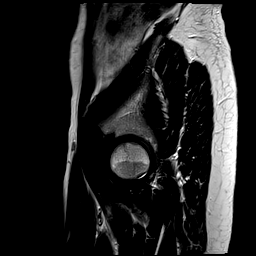
[im 14/27]
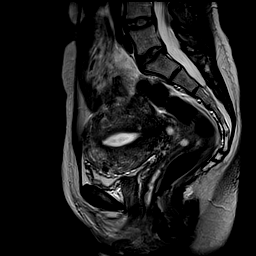
[im 27/27]
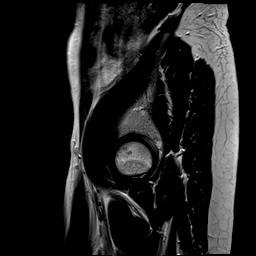

[Series 4: t2_tse axial · axial · 7.0mm · 0.51mm/px · z∈[-79,+139]mm · 4 of 25 slices shown]
[im 1/25]
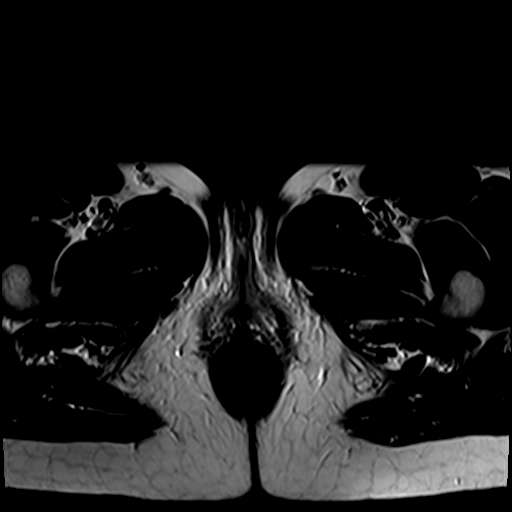
[im 9/25]
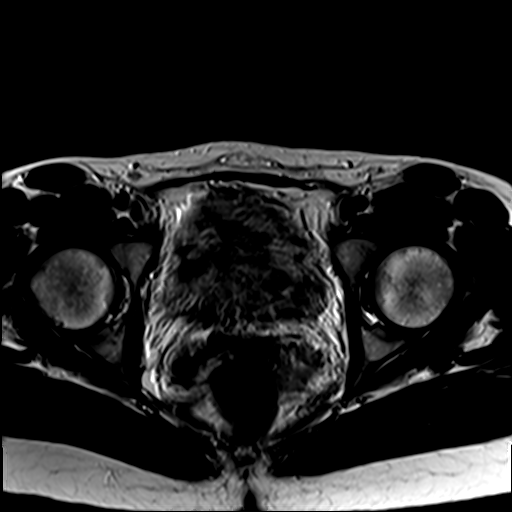
[im 17/25]
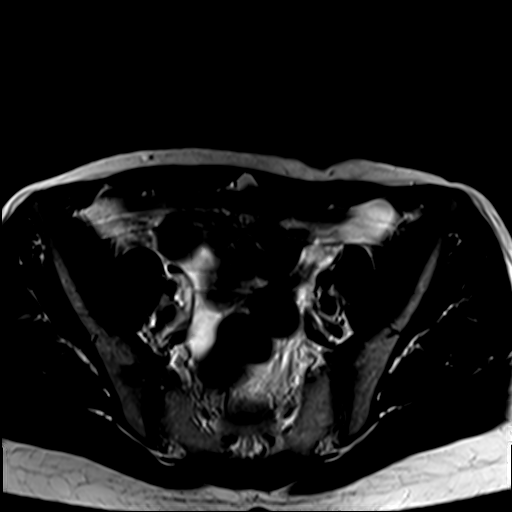
[im 25/25]
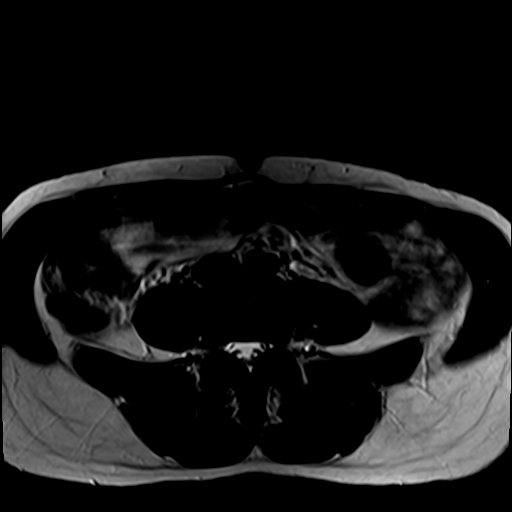

[Series 5: t2_tse axial fs · axial · 7.0mm · 0.51mm/px · z∈[-79,+139]mm · 4 of 25 slices shown]
[im 1/25]
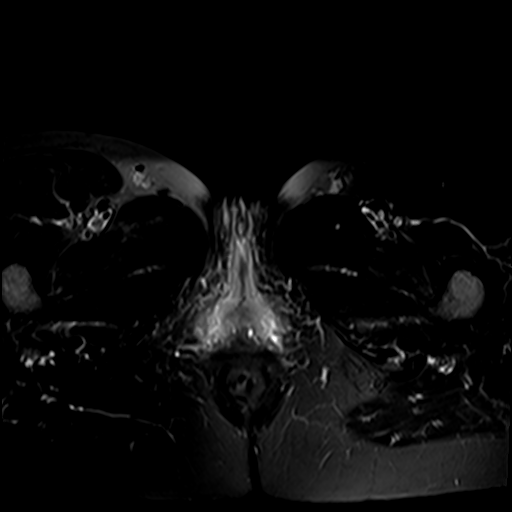
[im 9/25]
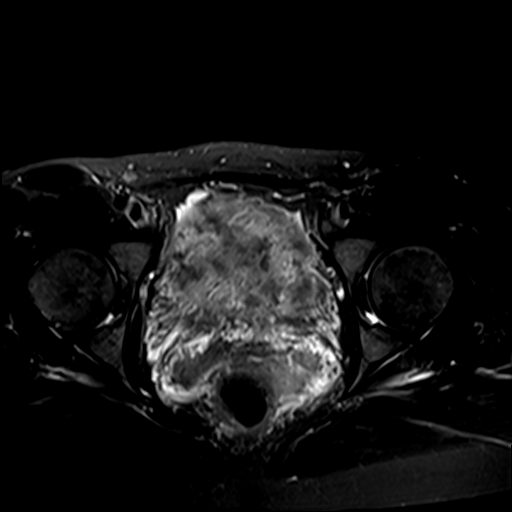
[im 17/25]
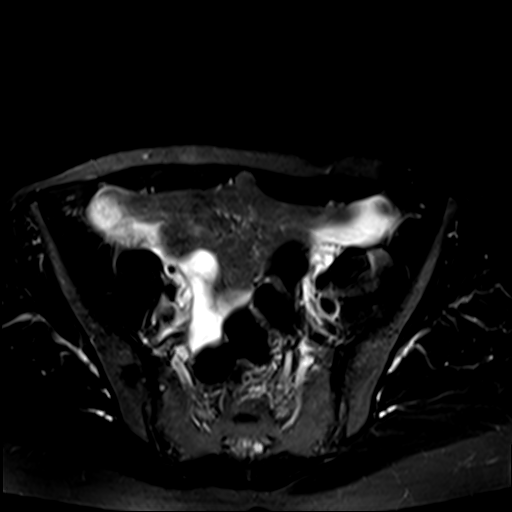
[im 25/25]
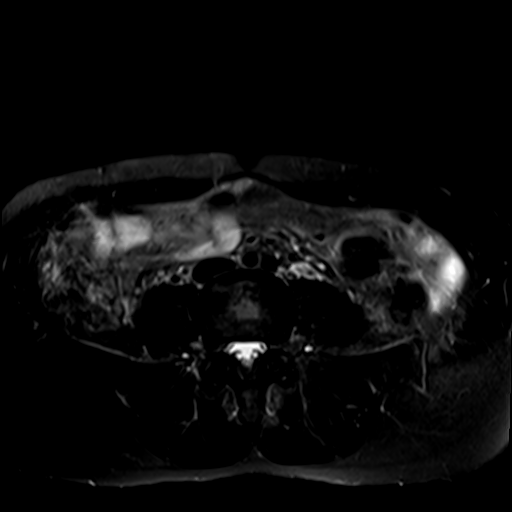

[Series 6: axial spgr no · axial · 7.0mm · 1.02mm/px · z∈[-71,+131]mm · 4 of 25 slices shown]
[im 1/25]
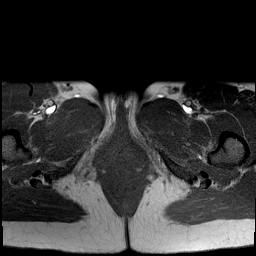
[im 9/25]
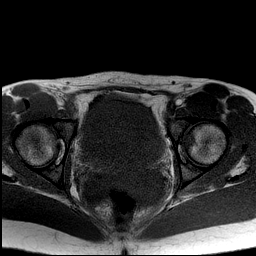
[im 17/25]
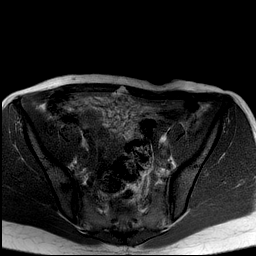
[im 25/25]
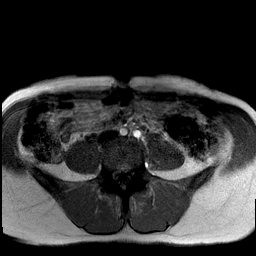

[Series 7: axial spgr fs-pre · axial · non-contrast · 7.0mm · 1.02mm/px · z∈[-71,+131]mm · 4 of 25 slices shown]
[im 1/25]
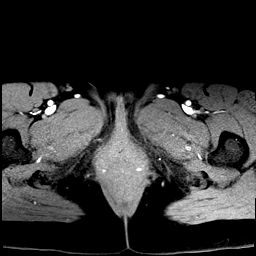
[im 9/25]
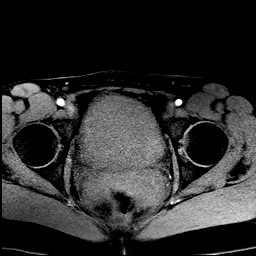
[im 17/25]
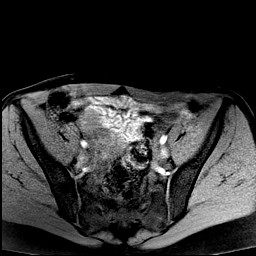
[im 25/25]
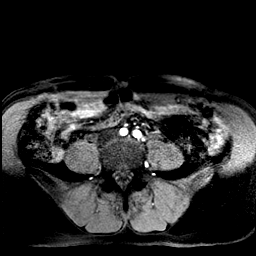

[Series 8: axial spgr fs-post · axial · 7.0mm · 1.02mm/px · z∈[-71,+131]mm · 4 of 25 slices shown (1 of 5)]
[im 1/25]
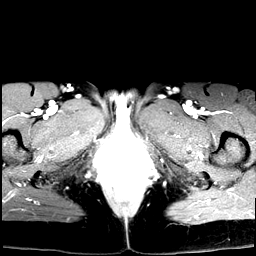
[im 9/25]
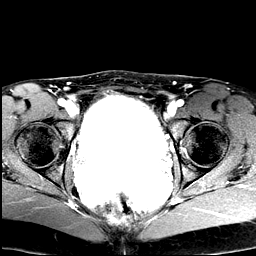
[im 17/25]
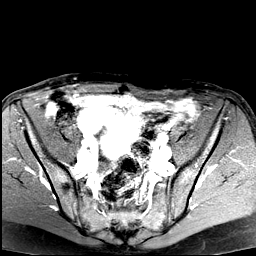
[im 25/25]
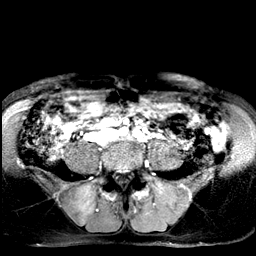

[Series 9: axial spgr fs-post · axial · 7.0mm · 1.02mm/px · z∈[-71,+131]mm · 4 of 25 slices shown (2 of 5)]
[im 1/25]
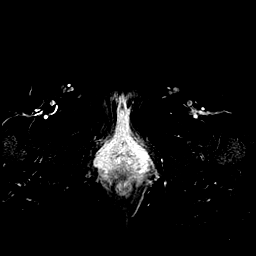
[im 9/25]
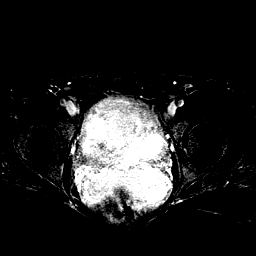
[im 17/25]
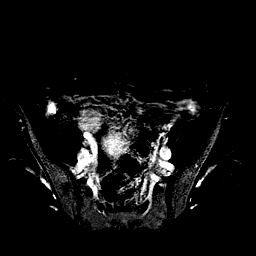
[im 25/25]
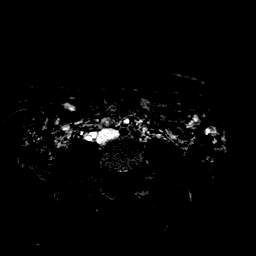

[Series 10: axial spgr fs-post · axial · 208.6mm · 1.02mm/px · 1 of 1 slices shown (3 of 5)]
[im 1/1]
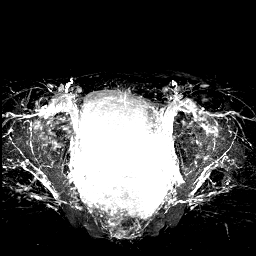

[Series 11: axial spgr fs-post · axial · 7.0mm · 1.02mm/px · z∈[-71,+131]mm · 4 of 25 slices shown (4 of 5)]
[im 1/25]
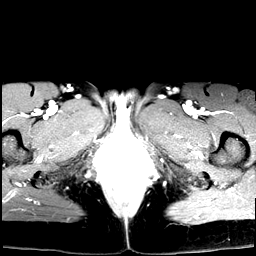
[im 9/25]
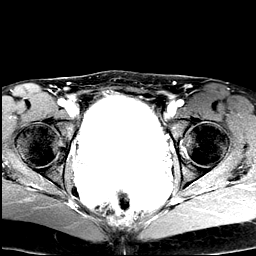
[im 17/25]
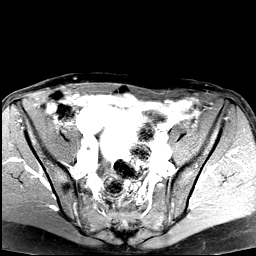
[im 25/25]
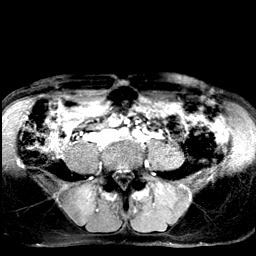

[Series 12: axial spgr fs-post · axial · 7.0mm · 1.02mm/px · z∈[-71,-4]mm · 2 of 25 slices shown (5 of 5)]
[im 1/25]
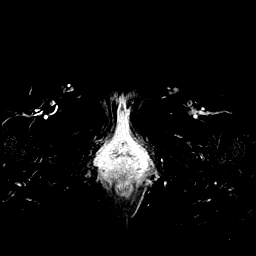
[im 9/25]
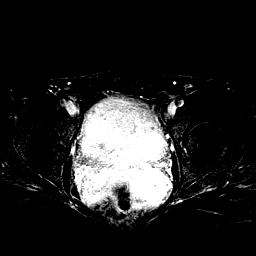

[37 of 48 positions shown; findings below may reference images not displayed]

FINDINGS: Urinary Tract: No bladder or urethral abnormality identified.

Bowel:  Unremarkable visualized pelvic bowel loops.

Vascular/Lymphatic: No pathologically enlarged lymph nodes or other
significant abnormality.

Reproductive:

-- Uterus: Measures 12.5 x 8.3 by 9.3 cm (volume = 510 cm^3).
Multiple small fibroids are seen involving the uterus diffusely. The
largest fibroid is intramural in location in the fundus measuring
4.2 cm. A subserosal fibroid is seen in the right posterior fundus
measuring 2.8 cm.

Endometrial thickness measures 14 mm on today's exam. A 1.6 cm
enhancing soft tissue nodule is seen in the fundal portion of the
endometrial cavity, consistent with an endometrial polyp. Normal
appearance of cervix and vagina.

-- Intracavitary fibroids:  None.

-- Pedunculated fibroids: None.

-- Fibroid contrast enhancement: All fibroids show contrast
enhancement, without significant degeneration/devascularization.

-- Right ovary:  Appears normal.  A mild right hydrosalpinx is seen.

-- Left ovary:  Appears normal.  No mass identified.

Other: No abnormal free fluid.

Musculoskeletal:  Unremarkable.
IMPRESSION: Multiple uterine fibroids measuring up to 4.2 cm. No intracavitary
or pedunculated fibroids identified.

Endometrial thickness measures 14 mm, with 1.6 cm enhancing
endometrial polyp in the fundal region.

Mild right hydrosalpinx.

Normal appearance of both ovaries.

## 2019-06-04 MED FILL — valACYclovir HCL 1 GM TABS: 1 | 30 days supply | Qty: 30 | Fill #4

## 2019-06-13 DIAGNOSIS — Z6824 Body mass index (BMI) 24.0-24.9, adult: Secondary | ICD-10-CM | POA: Diagnosis not present

## 2019-06-13 DIAGNOSIS — Z1231 Encounter for screening mammogram for malignant neoplasm of breast: Secondary | ICD-10-CM | POA: Diagnosis not present

## 2019-06-13 DIAGNOSIS — Z01419 Encounter for gynecological examination (general) (routine) without abnormal findings: Secondary | ICD-10-CM | POA: Diagnosis not present

## 2019-06-17 ENCOUNTER — Other Ambulatory Visit: Payer: Self-pay | Admitting: Obstetrics and Gynecology

## 2019-06-17 DIAGNOSIS — N6310 Unspecified lump in the right breast, unspecified quadrant: Secondary | ICD-10-CM

## 2019-07-04 ENCOUNTER — Ambulatory Visit
Admission: RE | Admit: 2019-07-04 | Discharge: 2019-07-04 | Disposition: A | Discharge status: Home / Self Care | Payer: 59 | Source: Ambulatory Visit | Attending: Obstetrics and Gynecology | Admitting: Obstetrics and Gynecology

## 2019-07-04 ENCOUNTER — Ambulatory Visit: Payer: 59 | Attending: Internal Medicine

## 2019-07-04 ENCOUNTER — Other Ambulatory Visit: Payer: Self-pay

## 2019-07-04 DIAGNOSIS — N6310 Unspecified lump in the right breast, unspecified quadrant: Secondary | ICD-10-CM

## 2019-07-04 DIAGNOSIS — N6011 Diffuse cystic mastopathy of right breast: Secondary | ICD-10-CM | POA: Diagnosis not present

## 2019-07-04 DIAGNOSIS — R922 Inconclusive mammogram: Secondary | ICD-10-CM | POA: Diagnosis not present

## 2019-07-04 DIAGNOSIS — Z23 Encounter for immunization: Secondary | ICD-10-CM

## 2019-07-04 NOTE — Progress Notes (Signed)
   Covid-19 Vaccination Clinic  Name:  Misty Nunez    MRN: [id number] DOB: 01-18-1966  07/04/2019  Misty Nunez was observed post Covid-19 immunization for 15 minutes without incident. She was provided with Vaccine Information Sheet and instruction to access the V-Safe system.   Misty Nunez was instructed to call 911 with any severe reactions post vaccine: Marland Kitchen Difficulty breathing  . Swelling of face and throat  . A fast heartbeat  . A bad rash all over body  . Dizziness and weakness   Immunizations Administered    Name Date Dose VIS Date Route   Pfizer COVID-19 Vaccine 07/04/2019 12:08 PM 0.3 mL 03/29/2019 Intramuscular   Manufacturer: [place]   Lot: [id number]   [place]: [id number]

## 2019-07-08 MED FILL — valACYclovir HCL 1 GM TABS: 1 | 30 days supply | Qty: 30 | Fill #0

## 2019-07-11 ENCOUNTER — Other Ambulatory Visit (HOSPITAL_COMMUNITY): Payer: Self-pay | Admitting: Family Medicine

## 2019-07-11 DIAGNOSIS — Z0000 Encounter for general adult medical examination without abnormal findings: Secondary | ICD-10-CM | POA: Diagnosis not present

## 2019-07-11 DIAGNOSIS — Z5181 Encounter for therapeutic drug level monitoring: Secondary | ICD-10-CM | POA: Diagnosis not present

## 2019-07-11 DIAGNOSIS — E782 Mixed hyperlipidemia: Secondary | ICD-10-CM | POA: Diagnosis not present

## 2019-07-11 MED FILL — SCOPOLAMINE 1 MG/3DAYS PT72: 1 | 12 days supply | Qty: 4 | Fill #0

## 2019-07-31 ENCOUNTER — Ambulatory Visit: Payer: 59 | Attending: Internal Medicine

## 2019-07-31 DIAGNOSIS — Z23 Encounter for immunization: Secondary | ICD-10-CM

## 2019-07-31 NOTE — Progress Notes (Signed)
   Covid-19 Vaccination Clinic  Name:  Misty Nunez    MRN: [id number] DOB: [birth year]  07/31/2019  Misty Nunez was observed post Covid-19 immunization for 15 minutes without incident. She was provided with Vaccine Information Sheet and instruction to access the V-Safe system.   Misty Nunez was instructed to call 911 with any severe reactions post vaccine: Marland Kitchen Difficulty breathing  . Swelling of face and throat  . A fast heartbeat  . A bad rash all over body  . Dizziness and weakness   Immunizations Administered    Name Date Dose VIS Date Route   Pfizer COVID-19 Vaccine 07/31/2019 10:01 AM 0.3 mL 03/29/2019 Intramuscular   Manufacturer: [place]   Lot: [id number]   [place]: [id number]

## 2019-08-07 MED FILL — valACYclovir HCL 1 GM TABS: 1 | 90 days supply | Qty: 90 | Fill #0

## 2019-08-13 DIAGNOSIS — Z20828 Contact with and (suspected) exposure to other viral communicable diseases: Secondary | ICD-10-CM | POA: Diagnosis not present

## 2019-09-03 MED FILL — BETAMETHASONE DP 0.05% CRM: 0.05 | 30 days supply | Qty: 60 | Fill #1

## 2019-11-25 MED FILL — valACYclovir HCL 1 GM TABS: 1 | 90 days supply | Qty: 90 | Fill #1

## 2020-02-06 DIAGNOSIS — Z23 Encounter for immunization: Secondary | ICD-10-CM | POA: Diagnosis not present

## 2020-03-05 MED FILL — valACYclovir HCL 1 GM TABS: 1 | 90 days supply | Qty: 90 | Fill #2

## 2020-03-17 DIAGNOSIS — R232 Flushing: Secondary | ICD-10-CM | POA: Diagnosis not present

## 2020-03-17 DIAGNOSIS — Z90710 Acquired absence of both cervix and uterus: Secondary | ICD-10-CM | POA: Diagnosis not present

## 2020-03-22 ENCOUNTER — Other Ambulatory Visit (HOSPITAL_COMMUNITY): Payer: Self-pay | Admitting: Obstetrics and Gynecology

## 2020-03-23 MED FILL — VENLAFAXINE HCL ER 37.5 MG: 37.5 | 30 days supply | Qty: 30 | Fill #0

## 2020-04-08 ENCOUNTER — Other Ambulatory Visit (HOSPITAL_COMMUNITY): Payer: Self-pay | Admitting: Obstetrics and Gynecology

## 2020-04-08 MED FILL — PREMARIN 0.625 MG TABLET: 0.625 | 30 days supply | Qty: 30 | Fill #0

## 2020-05-04 MED FILL — PREMARIN 0.625 MG TABLET: 0.625 | 30 days supply | Qty: 30 | Fill #1

## 2020-05-04 MED FILL — SCOPOLAMINE 1 MG/3 DAY PATC: 1 | 12 days supply | Qty: 4 | Fill #1

## 2020-06-01 MED FILL — valACYclovir HCL 1 GM TABS: 1 | 30 days supply | Qty: 30 | Fill #3

## 2020-06-01 MED FILL — PREMARIN 0.625 MG TABLET: 0.625 | 30 days supply | Qty: 30 | Fill #2

## 2020-06-15 DIAGNOSIS — Z90710 Acquired absence of both cervix and uterus: Secondary | ICD-10-CM | POA: Diagnosis not present

## 2020-06-15 DIAGNOSIS — Z1231 Encounter for screening mammogram for malignant neoplasm of breast: Secondary | ICD-10-CM | POA: Diagnosis not present

## 2020-06-15 DIAGNOSIS — Z01419 Encounter for gynecological examination (general) (routine) without abnormal findings: Secondary | ICD-10-CM | POA: Diagnosis not present

## 2020-06-15 DIAGNOSIS — Z780 Asymptomatic menopausal state: Secondary | ICD-10-CM | POA: Diagnosis not present

## 2020-07-09 ENCOUNTER — Other Ambulatory Visit (HOSPITAL_BASED_OUTPATIENT_CLINIC_OR_DEPARTMENT_OTHER): Payer: Self-pay

## 2020-08-17 DIAGNOSIS — Z0000 Encounter for general adult medical examination without abnormal findings: Secondary | ICD-10-CM | POA: Diagnosis not present

## 2020-08-17 DIAGNOSIS — Z5181 Encounter for therapeutic drug level monitoring: Secondary | ICD-10-CM | POA: Diagnosis not present

## 2020-08-17 DIAGNOSIS — E782 Mixed hyperlipidemia: Secondary | ICD-10-CM | POA: Diagnosis not present

## 2020-12-16 ENCOUNTER — Ambulatory Visit (INDEPENDENT_AMBULATORY_CARE_PROVIDER_SITE_OTHER): Payer: BC Managed Care – PPO

## 2020-12-16 ENCOUNTER — Encounter: Payer: Self-pay | Admitting: Podiatry

## 2020-12-16 ENCOUNTER — Other Ambulatory Visit: Payer: Self-pay

## 2020-12-16 ENCOUNTER — Ambulatory Visit (INDEPENDENT_AMBULATORY_CARE_PROVIDER_SITE_OTHER): Payer: BC Managed Care – PPO | Admitting: Podiatry

## 2020-12-16 DIAGNOSIS — M2011 Hallux valgus (acquired), right foot: Secondary | ICD-10-CM

## 2020-12-16 DIAGNOSIS — M778 Other enthesopathies, not elsewhere classified: Secondary | ICD-10-CM

## 2020-12-16 DIAGNOSIS — L603 Nail dystrophy: Secondary | ICD-10-CM | POA: Diagnosis not present

## 2020-12-16 DIAGNOSIS — M2012 Hallux valgus (acquired), left foot: Secondary | ICD-10-CM

## 2020-12-16 DIAGNOSIS — L608 Other nail disorders: Secondary | ICD-10-CM | POA: Diagnosis not present

## 2020-12-16 DIAGNOSIS — A499 Bacterial infection, unspecified: Secondary | ICD-10-CM | POA: Diagnosis not present

## 2020-12-16 NOTE — Progress Notes (Signed)
Subjective:  Patient ID: Misty Nunez, female    DOB: 05-16-1965,  MRN: [id number] HPI Chief Complaint  Patient presents with   Foot Pain    1st MPJ and arch bilateral - sharp pains x several months, bunion deformities   Nail Problem    Hallux nail left - thick, lifted off nail bed, not sore, no injury, had to have right hallux nail removed previously for same problem   New Patient (Initial Visit)    55 y.o. female presents with the above complaint.   Denies fever chills nausea vomiting muscle aches pains calf pain back pain chest pain shortness of breath.  She presents with her husband Misty Nunez today.  Past Medical History:  Diagnosis Date   Chronic anal fissure    Fibroid, uterine    Hemorrhoids    Past Surgical History:  Procedure Laterality Date   BAND HEMORRHOIDECTOMY  04/2010   Dr Earlean Shawl x 3   EXAMINATION UNDER ANESTHESIA  03/29/2011   Procedure: EXAM UNDER ANESTHESIA;  Surgeon: Gayland Curry, MD;  Location: [place];  Service: General;  Laterality: N/A;   EXCISIONAL HEMORRHOIDECTOMY  05/01/10   Rt posterolateral (Grade 4 necrotic)   HEMORROIDECTOMY  11/07/00   left lateral & Rt posterior   IR RADIOLOGIST EVAL & MGMT  12/13/2017   ROBOTIC ASSISTED LAPAROSCOPIC HYSTERECTOMY AND SALPINGECTOMY Bilateral 03/22/2018   Procedure: XI ROBOTIC ASSISTED LAPAROSCOPIC HYSTERECTOMY AND SALPINGECTOMY;  Surgeon: Servando Salina, MD;  Location: [place];  Service: Gynecology;  Laterality: Bilateral;   SPHINCTEROTOMY  03/29/2011   Procedure: SPHINCTEROTOMY;  Surgeon: Gayland Curry, MD;  Location: [place];  Service: General;  Laterality: N/A;  lateral anal sphincterotomy   STAPLE HEMORRHOIDECTOMY  11/14/2006   [place]   TUBAL LIGATION  1994    Current Outpatient Medications:    estrogens, conjugated, (PREMARIN) 0.625 MG tablet, TAKE 1 TABLET BY MOUTH ONCE A DAY, Disp: 30 tablet, Rfl: 11   Misc Natural Products (OSTEO BI-FLEX ADV JOINT SHIELD PO), Take 2 tablets by mouth daily.  , Disp: , Rfl:    valACYclovir (VALTREX) 1000 MG tablet, Take 1,000 mg by mouth daily. , Disp: , Rfl:    vitamin B-12 (CYANOCOBALAMIN) 1000 MCG tablet, Take 1,000 mcg by mouth daily. , Disp: , Rfl:   Allergies  Allergen Reactions   Flagyl [Metronidazole] Hives   Review of Systems Objective:  There were no vitals filed for this visit.  General: Well developed, nourished, in no acute distress, alert and oriented x3   Dermatological: Skin is warm, dry and supple bilateral. Nails x 10 are well maintained; remaining integument appears unremarkable at this time. There are no open sores, no preulcerative lesions, no rash or signs of infection present.  Hallux nail left demonstrates distal onycholysis but there is no other dermatophytosis around on the skin.  Vascular: Dorsalis Pedis artery and Posterior Tibial artery pedal pulses are 2/4 bilateral with immedate capillary fill time. Pedal hair growth present. No varicosities and no lower extremity edema present bilateral.   Neruologic: Grossly intact via light touch bilateral. Vibratory intact via tuning fork bilateral. Protective threshold with Semmes Wienstein monofilament intact to all pedal sites bilateral. Patellar and Achilles deep tendon reflexes 2+ bilateral. No Babinski or clonus noted bilateral.   Musculoskeletal: No gross boney pedal deformities bilateral. No pain, crepitus, or limitation noted with foot and ankle range of motion bilateral. Muscular strength 5/5 in all groups tested bilateral.  Hallux abductovalgus deformity bilateral.  Gait: Unassisted, Nonantalgic.  Radiographs:  None taken  Assessment & Plan:   Assessment: Hallux valgus bilateral.  Nail dystrophy hallux left  Plan: Samples of the skin and nail were taken today for pathologic evaluation I will follow-up with her in 1 month     Misty Nunez, [place]

## 2020-12-29 ENCOUNTER — Encounter: Payer: Self-pay | Admitting: Podiatry

## 2020-12-31 ENCOUNTER — Telehealth: Payer: Self-pay | Admitting: *Deleted

## 2020-12-31 NOTE — Telephone Encounter (Signed)
-----   Message from Garrel Ridgel, [place] sent at 12/30/2020  9:15 AM EDT ----- Culture is negative for fungus, positive for bacteria-send in cortisporin drops to place around and under the toenail.

## 2020-12-31 NOTE — Telephone Encounter (Signed)
Attempted to call the patient twice and only one number listed for patient, error message as if number had been discontinued.

## 2021-01-11 DIAGNOSIS — Z23 Encounter for immunization: Secondary | ICD-10-CM | POA: Diagnosis not present

## 2021-02-08 ENCOUNTER — Other Ambulatory Visit: Payer: Self-pay

## 2021-02-08 ENCOUNTER — Other Ambulatory Visit (HOSPITAL_COMMUNITY): Payer: Self-pay

## 2021-02-08 ENCOUNTER — Ambulatory Visit (INDEPENDENT_AMBULATORY_CARE_PROVIDER_SITE_OTHER): Payer: BC Managed Care – PPO | Admitting: Podiatry

## 2021-02-08 DIAGNOSIS — M778 Other enthesopathies, not elsewhere classified: Secondary | ICD-10-CM

## 2021-02-08 DIAGNOSIS — L603 Nail dystrophy: Secondary | ICD-10-CM

## 2021-02-08 MED ORDER — DEXAMETHASONE SODIUM PHOSPHATE 120 MG/30ML IJ SOLN
2.0000 mg | Freq: Once | INTRAMUSCULAR | Status: AC
  Administered 2021-02-08: 2 mg via INTRA_ARTICULAR

## 2021-02-08 MED ORDER — NEOMYCIN-POLYMYXIN-HC 1 % OT SOLN
OTIC | 1 refills | Status: AC
  Filled 2021-02-08: qty 10, 30d supply, fill #0

## 2021-02-08 NOTE — Progress Notes (Signed)
She presents today for follow-up of her hallux nail left.  She states that she did not hear anything back from her pathology.  She is also complaining of throbbing hallux today states that hurts right here she points to the first metatarsophalangeal joint of the right foot.  Objective: Vital signs are stable she is alert and oriented x3 no reproducible pain on palpation of the left foot along the medial ankle where she is also complaining of pain.  There is no fluctuance and she does have good inversion against resistance.  She has hallux abductovalgus deformities bilateral with mild hammertoe deformity second bilateral of the right foot around the first metatarsophalangeal joint is limited on dorsiflexion she has good plantarflexion however she has no tenderness on palpation of the sesamoidal apparatus.  She has mild extensors at the level of the interphalangeal joint.  Hallux nail left does demonstrate distal onycholysis and pathology result does demonstrate bacterial infection with distal keratinization pathology.  Assessment: Bacterial infection of the hallux nail left.  Hallux abductovalgus deformities bilateral.  Hammertoe deformities bilateral.  Hallux limitus first metatarsophalangeal joint right foot with capsulitis.  Plan: Discussed etiology pathology and surgical therapy sent over her medication consisting of Corticosporin otic to [facility].  At this point we also injected the first metatarsophalangeal joint of the right foot with dexamethasone and local anesthetic.  She tolerated procedure well will notify me with questions or concerns call me when she needs me.

## 2021-02-09 ENCOUNTER — Other Ambulatory Visit (HOSPITAL_COMMUNITY): Payer: Self-pay

## 2021-02-19 ENCOUNTER — Encounter (HOSPITAL_COMMUNITY): Payer: Self-pay

## 2021-02-19 ENCOUNTER — Other Ambulatory Visit: Payer: Self-pay

## 2021-02-19 ENCOUNTER — Ambulatory Visit (HOSPITAL_COMMUNITY)
Admission: EM | Admit: 2021-02-19 | Discharge: 2021-02-19 | Disposition: A | Discharge status: Home / Self Care | Payer: BC Managed Care – PPO | Attending: Physician Assistant | Admitting: Physician Assistant

## 2021-02-19 DIAGNOSIS — R051 Acute cough: Secondary | ICD-10-CM

## 2021-02-19 DIAGNOSIS — J329 Chronic sinusitis, unspecified: Secondary | ICD-10-CM | POA: Diagnosis not present

## 2021-02-19 DIAGNOSIS — J40 Bronchitis, not specified as acute or chronic: Secondary | ICD-10-CM

## 2021-02-19 MED ORDER — AMOXICILLIN-POT CLAVULANATE 875-125 MG PO TABS: 1.0000 | ORAL_TABLET | Freq: Two times a day (BID) | ORAL | 0 refills | Status: AC

## 2021-02-19 MED ORDER — PROMETHAZINE-DM 6.25-15 MG/5ML PO SYRP: 5.0000 mL | ORAL_SOLUTION | Freq: Three times a day (TID) | ORAL | 0 refills | Status: AC | PRN

## 2021-02-19 MED ORDER — PREDNISONE 20 MG PO TABS: 40.0000 mg | ORAL_TABLET | Freq: Every day | ORAL | 0 refills | Status: AC

## 2021-02-19 NOTE — ED Provider Notes (Signed)
[place]    CSN: [id number] Arrival date & time: 02/19/21  1534      History   Chief Complaint Chief Complaint  Patient presents with   Cough   Sore Throat    HPI Misty Nunez is a 55 y.o. female.   Patient presents today with a 9-day history of URI symptoms.  Reports that she got sick after traveling to [facility].  Denies any known sick contacts.  She has had ongoing severe cough that is interfering with her ability to sleep, sore throat, nausea, nasal congestion.  Denies any fever, vomiting, diarrhea, chest pain, shortness of breath.  She has tried multiple over-the-counter medications including Mucinex, Alka-Seltzer plus without improvement of symptoms.  She has had COVID-19 and influenza vaccines.  Denies any past medical history of allergies, asthma, COPD, smoking.  Denies any recent antibiotic use.  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   Past Medical History:  Diagnosis Date   Chronic anal fissure    Fibroid, uterine    Hemorrhoids     Patient Active Problem List   Diagnosis Date Noted   Status post total hysterectomy 03/22/2018   Lateral epicondylitis of right elbow 12/30/2017   Pain in joint of right elbow 06/14/2017   Anal fissure 02/22/2011    Past Surgical History:  Procedure Laterality Date   BAND HEMORRHOIDECTOMY  04/2010   Dr Earlean Shawl x 3   EXAMINATION UNDER ANESTHESIA  03/29/2011   Procedure: EXAM UNDER ANESTHESIA;  Surgeon: Gayland Curry, MD;  Location: [place];  Service: General;  Laterality: N/A;   EXCISIONAL HEMORRHOIDECTOMY  05/01/10   Rt posterolateral (Grade 4 necrotic)   HEMORROIDECTOMY  11/07/00   left lateral & Rt posterior   IR RADIOLOGIST EVAL & MGMT  12/13/2017   ROBOTIC ASSISTED LAPAROSCOPIC HYSTERECTOMY AND SALPINGECTOMY Bilateral 03/22/2018   Procedure: XI ROBOTIC ASSISTED LAPAROSCOPIC HYSTERECTOMY AND SALPINGECTOMY;  Surgeon: Servando Salina, MD;  Location:  [place];  Service: Gynecology;  Laterality: Bilateral;   SPHINCTEROTOMY  03/29/2011   Procedure: SPHINCTEROTOMY;  Surgeon: Gayland Curry, MD;  Location: [place];  Service: General;  Laterality: N/A;  lateral anal sphincterotomy   STAPLE HEMORRHOIDECTOMY  11/14/2006   [place]    OB History   No obstetric history on file.      Home Medications    Prior to Admission medications   Medication Sig Start Date End Date Taking? Authorizing Provider  amoxicillin-clavulanate (AUGMENTIN) 875-125 MG tablet Take 1 tablet by mouth every 12 (twelve) hours. 02/19/21  Yes Joyel Chenette K, PA-C  predniSONE (DELTASONE) 20 MG tablet Take 2 tablets (40 mg total) by mouth daily for 4 days. 02/19/21 02/23/21 Yes Yanice Maqueda K, PA-C  promethazine-dextromethorphan (PROMETHAZINE-DM) 6.25-15 MG/5ML syrup Take 5 mLs by mouth 3 (three) times daily as needed for cough. 02/19/21  Yes Rotha Cassels K, PA-C  estrogens, conjugated, (PREMARIN) 0.625 MG tablet TAKE 1 TABLET BY MOUTH ONCE A DAY 04/08/20 04/08/21  Servando Salina, MD  Misc Natural Products (OSTEO BI-FLEX ADV JOINT SHIELD PO) Take 2 tablets by mouth daily.     [provider]  NEOMYCIN-POLYMYXIN-HYDROCORTISONE (CORTISPORIN) 1 % SOLN OTIC solution Apply 1-2 drops to toe twice daily after soaking as directed 02/08/21   Hyatt, Max T, DPM  valACYclovir (VALTREX) 1000 MG tablet Take 1,000 mg by mouth daily.     [provider]  vitamin B-12 (CYANOCOBALAMIN) 1000 MCG tablet Take 1,000 mcg by mouth daily.     [provider]    Family History Family History  Problem Relation Age of Onset   Stroke Father     Social History Social History   Tobacco Use   Smoking status: Never   Smokeless tobacco: Never  Substance Use Topics   Alcohol use: Yes    Comment: OCC   Drug use: No     Allergies   Flagyl [metronidazole]   Review of Systems Review of Systems  Constitutional:  Positive for  activity change and fatigue. Negative for appetite change and fever.  HENT:  Positive for congestion, postnasal drip and sore throat. Negative for sinus pressure and sneezing.   Respiratory:  Positive for cough. Negative for shortness of breath.   Cardiovascular:  Negative for chest pain.  Gastrointestinal:  Negative for abdominal pain, diarrhea, nausea and vomiting.  Musculoskeletal:  Negative for arthralgias and myalgias.  Neurological:  Negative for dizziness, light-headedness and headaches.    Physical Exam Triage Vital Signs ED Triage Vitals  Enc Vitals Group     BP 02/19/21 1724 134/87     Pulse Rate 02/19/21 1724 69     Resp 02/19/21 1724 17     Temp 02/19/21 1724 98.9 F (37.2 C)     Temp Source 02/19/21 1724 Oral     SpO2 02/19/21 1724 96 %     Weight --      Height --      Head Circumference --      Peak Flow --      Pain Score 02/19/21 1727 6     Pain Loc --      Pain Edu? --      Excl. in [place]? --    No data found.  Updated Vital Signs BP 134/87 (BP Location: Right Arm)   Pulse 69   Temp 98.9 F (37.2 C) (Oral)   Resp 17   LMP 03/12/2018   SpO2 96%   Visual Acuity Right Eye Distance:   Left Eye Distance:   Bilateral Distance:    Right Eye Near:   Left Eye Near:    Bilateral Near:     Physical Exam Vitals reviewed.  Constitutional:      General: She is awake. She is not in acute distress.    Appearance: Normal appearance. She is well-developed. She is not ill-appearing.     Comments: Very pleasant female appears stated age in no acute distress sitting comfortably in exam room  HENT:     Head: Normocephalic and atraumatic.     Right Ear: Tympanic membrane, ear canal and external ear normal. Tympanic membrane is not erythematous or bulging.     Left Ear: Tympanic membrane, ear canal and external ear normal. Tympanic membrane is not erythematous or bulging.     Nose:     Right Sinus: Maxillary sinus tenderness and frontal sinus tenderness present.      Left Sinus: Maxillary sinus tenderness and frontal sinus tenderness present.     Mouth/Throat:     Pharynx: Uvula midline. Posterior oropharyngeal erythema present. No oropharyngeal exudate.     Comments: Erythema and drainage in posterior oropharynx Cardiovascular:     Rate and Rhythm: Normal rate and regular rhythm.     Heart sounds: Normal heart sounds, S1 normal and S2 normal. No murmur heard. Pulmonary:     Effort: Pulmonary effort is normal.     Breath  sounds: Normal breath sounds. No wheezing, rhonchi or rales.     Comments: Reactive cough with deep breathing Psychiatric:        Behavior: Behavior is cooperative.     UC Treatments / Results  Labs (all labs ordered are listed, but only abnormal results are displayed) Labs Reviewed - No data to display  EKG   Radiology No results found.  Procedures Procedures (including critical care time)  Medications Ordered in UC Medications - No data to display  Initial Impression / Assessment and Plan / UC Course  I have reviewed the triage vital signs and the nursing notes.  Pertinent labs & imaging results that were available during my care of the patient were reviewed by me and considered in my medical decision making (see chart for details).     No indication for viral testing given patient has been symptomatic for 1.5 weeks.  Concern for bacterial infection given prolonged and worsening symptoms.  Patient was started on Augmentin twice daily for 7 days.  She was prescribed prednisone burst with instruction not to take NSAIDs with this medication.  She can use Promethazine DM for cough but should not drive or drink alcohol with this medication as drowsiness is a common side effect.  Recommended she rest and drink plenty of fluid.  She is to use Mucinex, Flonase, Tylenol for additional symptom relief.  Discussed alarm symptoms that warrant emergent evaluation.  Encouraged to follow-up with primary care provider early next week if  symptoms have not significantly improved.  Strict return precautions given to which she expressed understanding.  Final Clinical Impressions(s) / UC Diagnoses   Final diagnoses:  Sinobronchitis  Acute cough     Discharge Instructions      We are treating you for sinobronchitis.  Please take Augmentin twice daily for 7 days.  This can upset your stomach so take it with food.  I have also called in prednisone burst (40 mg for 4 days).  Take this in the morning.  You should avoid NSAIDs including aspirin, ibuprofen/Advil, naproxen/Aleve with this medication because it can cause stomach bleeding.  You can take Promethazine DM for cough.  This will make you sleepy do not drive or drink alcohol while taking it.  I recommend Mucinex, Flonase, Tylenol for additional symptom relief.  Make sure you are resting and drinking plenty of fluid.  If anything worsens please return for reevaluation.     ED Prescriptions     Medication Sig Dispense Auth. Provider   amoxicillin-clavulanate (AUGMENTIN) 875-125 MG tablet Take 1 tablet by mouth every 12 (twelve) hours. 14 tablet Akiem Urieta K, PA-C   predniSONE (DELTASONE) 20 MG tablet Take 2 tablets (40 mg total) by mouth daily for 4 days. 8 tablet Tovah Slavick K, PA-C   promethazine-dextromethorphan (PROMETHAZINE-DM) 6.25-15 MG/5ML syrup Take 5 mLs by mouth 3 (three) times daily as needed for cough. 118 mL Mekenna Finau K, PA-C      PDMP not reviewed this encounter.   Terrilee Croak, PA-C 02/19/21 1743

## 2021-02-19 NOTE — Discharge Instructions (Signed)
We are treating you for sinobronchitis.  Please take Augmentin twice daily for 7 days.  This can upset your stomach so take it with food.  I have also called in prednisone burst (40 mg for 4 days).  Take this in the morning.  You should avoid NSAIDs including aspirin, ibuprofen/Advil, naproxen/Aleve with this medication because it can cause stomach bleeding.  You can take Promethazine DM for cough.  This will make you sleepy do not drive or drink alcohol while taking it.  I recommend Mucinex, Flonase, Tylenol for additional symptom relief.  Make sure you are resting and drinking plenty of fluid.  If anything worsens please return for reevaluation.

## 2021-02-19 NOTE — ED Triage Notes (Signed)
Pt presents with non productive cough, sore throat, and nausea for over a week.

## 2021-03-23 ENCOUNTER — Other Ambulatory Visit (HOSPITAL_COMMUNITY): Payer: Self-pay

## 2021-03-23 MED FILL — Estrogens, Conjugated Tab 0.625 MG: ORAL | 30 days supply | Qty: 30 | Fill #0 | Status: AC

## 2021-05-04 ENCOUNTER — Other Ambulatory Visit (HOSPITAL_COMMUNITY): Payer: Self-pay

## 2021-05-06 ENCOUNTER — Other Ambulatory Visit (HOSPITAL_COMMUNITY): Payer: Self-pay

## 2021-05-06 MED ORDER — PREMARIN 0.625 MG PO TABS
ORAL_TABLET | ORAL | 2 refills | Status: DC
  Filled 2021-05-06: qty 30, 30d supply, fill #0
  Filled 2021-06-07 – 2021-07-26 (×2): qty 30, 30d supply, fill #1
  Filled 2021-08-25: qty 30, 30d supply, fill #2

## 2021-05-10 ENCOUNTER — Other Ambulatory Visit (HOSPITAL_COMMUNITY): Payer: Self-pay

## 2021-05-11 ENCOUNTER — Other Ambulatory Visit (HOSPITAL_COMMUNITY): Payer: Self-pay

## 2021-05-24 ENCOUNTER — Other Ambulatory Visit (HOSPITAL_COMMUNITY): Payer: Self-pay

## 2021-05-24 MED ORDER — VALACYCLOVIR HCL 1 G PO TABS
ORAL_TABLET | ORAL | 0 refills | Status: DC
  Filled 2021-05-24: qty 30, 30d supply, fill #0
  Filled 2021-06-20: qty 30, 30d supply, fill #1
  Filled 2021-07-22: qty 30, 30d supply, fill #2

## 2021-05-24 MED ORDER — BETAMETHASONE DIPROPIONATE 0.05 % EX CREA
TOPICAL_CREAM | CUTANEOUS | 0 refills | Status: AC
  Filled 2021-05-24: qty 60, 30d supply, fill #0

## 2021-05-25 ENCOUNTER — Other Ambulatory Visit (HOSPITAL_COMMUNITY): Payer: Self-pay

## 2021-05-26 ENCOUNTER — Other Ambulatory Visit (HOSPITAL_COMMUNITY): Payer: Self-pay

## 2021-05-27 ENCOUNTER — Other Ambulatory Visit (HOSPITAL_COMMUNITY): Payer: Self-pay

## 2021-06-07 ENCOUNTER — Other Ambulatory Visit (HOSPITAL_COMMUNITY): Payer: Self-pay

## 2021-06-08 ENCOUNTER — Other Ambulatory Visit (HOSPITAL_COMMUNITY): Payer: Self-pay

## 2021-06-12 ENCOUNTER — Other Ambulatory Visit (HOSPITAL_COMMUNITY): Payer: Self-pay

## 2021-06-17 ENCOUNTER — Other Ambulatory Visit (HOSPITAL_COMMUNITY): Payer: Self-pay

## 2021-06-21 ENCOUNTER — Other Ambulatory Visit (HOSPITAL_COMMUNITY): Payer: Self-pay

## 2021-06-22 DIAGNOSIS — Z01419 Encounter for gynecological examination (general) (routine) without abnormal findings: Secondary | ICD-10-CM | POA: Diagnosis not present

## 2021-06-22 DIAGNOSIS — Z13220 Encounter for screening for lipoid disorders: Secondary | ICD-10-CM | POA: Diagnosis not present

## 2021-06-22 DIAGNOSIS — Z0000 Encounter for general adult medical examination without abnormal findings: Secondary | ICD-10-CM | POA: Diagnosis not present

## 2021-06-22 DIAGNOSIS — Z131 Encounter for screening for diabetes mellitus: Secondary | ICD-10-CM | POA: Diagnosis not present

## 2021-06-22 DIAGNOSIS — Z90710 Acquired absence of both cervix and uterus: Secondary | ICD-10-CM | POA: Diagnosis not present

## 2021-06-22 DIAGNOSIS — Z1231 Encounter for screening mammogram for malignant neoplasm of breast: Secondary | ICD-10-CM | POA: Diagnosis not present

## 2021-07-23 ENCOUNTER — Other Ambulatory Visit (HOSPITAL_COMMUNITY): Payer: Self-pay

## 2021-07-26 ENCOUNTER — Other Ambulatory Visit (HOSPITAL_COMMUNITY): Payer: Self-pay

## 2021-08-25 ENCOUNTER — Other Ambulatory Visit (HOSPITAL_COMMUNITY): Payer: Self-pay

## 2021-08-26 ENCOUNTER — Other Ambulatory Visit (HOSPITAL_COMMUNITY): Payer: Self-pay

## 2021-08-26 MED ORDER — VALACYCLOVIR HCL 1 G PO TABS
1000.0000 mg | ORAL_TABLET | Freq: Every day | ORAL | 0 refills | Status: DC
  Filled 2021-08-26: qty 30, 30d supply, fill #0
  Filled 2021-09-29: qty 30, 30d supply, fill #1
  Filled 2021-11-01: qty 30, 30d supply, fill #2

## 2021-09-22 ENCOUNTER — Other Ambulatory Visit (HOSPITAL_COMMUNITY): Payer: Self-pay

## 2021-09-25 ENCOUNTER — Other Ambulatory Visit (HOSPITAL_COMMUNITY): Payer: Self-pay

## 2021-09-25 MED ORDER — PREMARIN 0.625 MG PO TABS
0.6250 mg | ORAL_TABLET | Freq: Every day | ORAL | 11 refills | Status: DC
  Filled 2021-09-25: qty 30, 30d supply, fill #0
  Filled 2021-11-08: qty 30, 30d supply, fill #1

## 2021-09-30 ENCOUNTER — Other Ambulatory Visit (HOSPITAL_COMMUNITY): Payer: Self-pay

## 2021-11-01 ENCOUNTER — Other Ambulatory Visit (HOSPITAL_COMMUNITY): Payer: Self-pay

## 2021-11-05 ENCOUNTER — Other Ambulatory Visit (HOSPITAL_COMMUNITY): Payer: Self-pay

## 2021-11-05 DIAGNOSIS — Z5181 Encounter for therapeutic drug level monitoring: Secondary | ICD-10-CM | POA: Diagnosis not present

## 2021-11-05 DIAGNOSIS — E782 Mixed hyperlipidemia: Secondary | ICD-10-CM | POA: Diagnosis not present

## 2021-11-05 DIAGNOSIS — Z8249 Family history of ischemic heart disease and other diseases of the circulatory system: Secondary | ICD-10-CM | POA: Diagnosis not present

## 2021-11-05 DIAGNOSIS — Z0000 Encounter for general adult medical examination without abnormal findings: Secondary | ICD-10-CM | POA: Diagnosis not present

## 2021-11-05 MED ORDER — VALACYCLOVIR HCL 1 G PO TABS
1000.0000 mg | ORAL_TABLET | Freq: Every day | ORAL | 3 refills | Status: DC
  Filled 2021-11-05 – 2021-12-08 (×2): qty 30, 30d supply, fill #0
  Filled 2022-01-11: qty 30, 30d supply, fill #1
  Filled 2022-02-16: qty 30, 30d supply, fill #2
  Filled 2022-03-20: qty 30, 30d supply, fill #3
  Filled 2022-05-03 – 2022-05-09 (×3): qty 30, 30d supply, fill #4
  Filled 2022-06-09: qty 30, 30d supply, fill #5
  Filled 2022-07-12: qty 30, 30d supply, fill #6
  Filled 2022-08-10: qty 30, 30d supply, fill #7
  Filled 2022-09-08: qty 30, 30d supply, fill #8
  Filled 2022-10-31 (×2): qty 30, 30d supply, fill #9

## 2021-11-08 ENCOUNTER — Other Ambulatory Visit (HOSPITAL_COMMUNITY): Payer: Self-pay

## 2021-12-08 ENCOUNTER — Other Ambulatory Visit (HOSPITAL_COMMUNITY): Payer: Self-pay

## 2021-12-16 ENCOUNTER — Other Ambulatory Visit (HOSPITAL_COMMUNITY): Payer: Self-pay

## 2022-01-11 ENCOUNTER — Other Ambulatory Visit (HOSPITAL_COMMUNITY): Payer: Self-pay

## 2022-02-17 ENCOUNTER — Other Ambulatory Visit (HOSPITAL_COMMUNITY): Payer: Self-pay

## 2022-03-20 ENCOUNTER — Other Ambulatory Visit (HOSPITAL_COMMUNITY): Payer: Self-pay

## 2022-03-21 ENCOUNTER — Other Ambulatory Visit (HOSPITAL_COMMUNITY): Payer: Self-pay

## 2022-04-04 ENCOUNTER — Other Ambulatory Visit (HOSPITAL_COMMUNITY): Payer: Self-pay

## 2022-04-13 ENCOUNTER — Other Ambulatory Visit (HOSPITAL_COMMUNITY): Payer: Self-pay

## 2022-04-15 ENCOUNTER — Other Ambulatory Visit (HOSPITAL_COMMUNITY): Payer: Self-pay

## 2022-04-16 ENCOUNTER — Other Ambulatory Visit (HOSPITAL_COMMUNITY): Payer: Self-pay

## 2022-04-16 MED ORDER — VEOZAH 45 MG PO TABS
1.0000 | ORAL_TABLET | Freq: Every day | ORAL | 11 refills | Status: AC
  Filled 2022-04-16: qty 30, 30d supply, fill #0

## 2022-04-25 ENCOUNTER — Other Ambulatory Visit (HOSPITAL_COMMUNITY): Payer: Self-pay

## 2022-04-29 ENCOUNTER — Other Ambulatory Visit (HOSPITAL_COMMUNITY): Payer: Self-pay

## 2022-05-03 ENCOUNTER — Other Ambulatory Visit: Payer: Self-pay

## 2022-05-03 ENCOUNTER — Other Ambulatory Visit (HOSPITAL_COMMUNITY): Payer: Self-pay

## 2022-05-03 MED ORDER — ESTROGENS CONJUGATED 0.625 MG PO TABS
0.6250 mg | ORAL_TABLET | Freq: Every day | ORAL | 11 refills | Status: DC
  Filled 2022-05-03 – 2022-05-11 (×2): qty 30, 30d supply, fill #0
  Filled 2022-06-09: qty 30, 30d supply, fill #1
  Filled 2022-07-12: qty 30, 30d supply, fill #2
  Filled 2022-08-10: qty 30, 30d supply, fill #3
  Filled 2022-09-08: qty 30, 30d supply, fill #4
  Filled 2023-03-21: qty 30, 30d supply, fill #5
  Filled 2023-04-15: qty 30, 30d supply, fill #6

## 2022-05-04 ENCOUNTER — Other Ambulatory Visit: Payer: Self-pay

## 2022-05-04 ENCOUNTER — Other Ambulatory Visit (HOSPITAL_COMMUNITY): Payer: Self-pay

## 2022-05-04 DIAGNOSIS — M79672 Pain in left foot: Secondary | ICD-10-CM | POA: Diagnosis not present

## 2022-05-04 DIAGNOSIS — M722 Plantar fascial fibromatosis: Secondary | ICD-10-CM | POA: Diagnosis not present

## 2022-05-04 DIAGNOSIS — M79671 Pain in right foot: Secondary | ICD-10-CM | POA: Diagnosis not present

## 2022-05-04 MED ORDER — MELOXICAM 7.5 MG PO TABS
7.5000 mg | ORAL_TABLET | Freq: Every morning | ORAL | 0 refills | Status: DC
  Filled 2022-05-04 – 2022-05-11 (×2): qty 30, 30d supply, fill #0

## 2022-05-05 ENCOUNTER — Other Ambulatory Visit: Payer: Self-pay

## 2022-05-09 ENCOUNTER — Other Ambulatory Visit: Payer: Self-pay

## 2022-05-10 ENCOUNTER — Other Ambulatory Visit (HOSPITAL_COMMUNITY): Payer: Self-pay

## 2022-05-10 ENCOUNTER — Encounter (HOSPITAL_COMMUNITY): Payer: Self-pay

## 2022-05-11 ENCOUNTER — Other Ambulatory Visit: Payer: Self-pay

## 2022-05-11 ENCOUNTER — Other Ambulatory Visit (HOSPITAL_COMMUNITY): Payer: Self-pay

## 2022-05-16 ENCOUNTER — Other Ambulatory Visit (HOSPITAL_COMMUNITY): Payer: Self-pay

## 2022-05-16 ENCOUNTER — Other Ambulatory Visit: Payer: Self-pay

## 2022-06-10 ENCOUNTER — Encounter (HOSPITAL_COMMUNITY): Payer: Self-pay

## 2022-06-10 ENCOUNTER — Other Ambulatory Visit: Payer: Self-pay

## 2022-06-10 ENCOUNTER — Other Ambulatory Visit (HOSPITAL_COMMUNITY): Payer: Self-pay

## 2022-06-13 ENCOUNTER — Other Ambulatory Visit: Payer: Self-pay

## 2022-06-24 ENCOUNTER — Other Ambulatory Visit (HOSPITAL_COMMUNITY): Payer: Self-pay

## 2022-06-24 DIAGNOSIS — M67872 Other specified disorders of synovium, left ankle and foot: Secondary | ICD-10-CM | POA: Diagnosis not present

## 2022-06-24 DIAGNOSIS — M67871 Other specified disorders of synovium, right ankle and foot: Secondary | ICD-10-CM | POA: Diagnosis not present

## 2022-06-24 MED ORDER — PEG 3350-KCL-NA BICARB-NACL 420 G PO SOLR
ORAL | 0 refills | Status: AC
  Filled 2022-06-24: qty 4000, 7d supply, fill #0

## 2022-06-24 MED ORDER — PREDNISONE 10 MG PO TABS
ORAL_TABLET | ORAL | 0 refills | Status: AC
  Filled 2022-06-24: qty 14, 6d supply, fill #0

## 2022-06-24 MED ORDER — MELOXICAM 7.5 MG PO TABS
ORAL_TABLET | ORAL | 0 refills | Status: AC
  Filled 2022-06-24: qty 60, 30d supply, fill #0

## 2022-06-27 DIAGNOSIS — Z86010 Personal history of colonic polyps: Secondary | ICD-10-CM | POA: Diagnosis not present

## 2022-06-27 DIAGNOSIS — Z09 Encounter for follow-up examination after completed treatment for conditions other than malignant neoplasm: Secondary | ICD-10-CM | POA: Diagnosis not present

## 2022-06-27 DIAGNOSIS — K649 Unspecified hemorrhoids: Secondary | ICD-10-CM | POA: Diagnosis not present

## 2022-06-27 DIAGNOSIS — D125 Benign neoplasm of sigmoid colon: Secondary | ICD-10-CM | POA: Diagnosis not present

## 2022-06-28 DIAGNOSIS — Z1231 Encounter for screening mammogram for malignant neoplasm of breast: Secondary | ICD-10-CM | POA: Diagnosis not present

## 2022-07-12 ENCOUNTER — Other Ambulatory Visit: Payer: Self-pay

## 2022-07-12 ENCOUNTER — Other Ambulatory Visit (HOSPITAL_COMMUNITY): Payer: Self-pay

## 2022-07-13 ENCOUNTER — Other Ambulatory Visit (HOSPITAL_COMMUNITY): Payer: Self-pay

## 2022-08-09 DIAGNOSIS — R12 Heartburn: Secondary | ICD-10-CM | POA: Diagnosis not present

## 2022-08-10 ENCOUNTER — Other Ambulatory Visit: Payer: Self-pay

## 2022-08-10 ENCOUNTER — Other Ambulatory Visit (HOSPITAL_COMMUNITY): Payer: Self-pay

## 2022-08-10 MED ORDER — OMEPRAZOLE 40 MG PO CPDR
40.0000 mg | DELAYED_RELEASE_CAPSULE | Freq: Every morning | ORAL | 0 refills | Status: AC
  Filled 2022-08-10: qty 30, 30d supply, fill #0
  Filled 2022-11-25: qty 30, 30d supply, fill #1

## 2022-09-08 ENCOUNTER — Other Ambulatory Visit: Payer: Self-pay

## 2022-09-08 ENCOUNTER — Other Ambulatory Visit (HOSPITAL_COMMUNITY): Payer: Self-pay

## 2022-09-09 ENCOUNTER — Other Ambulatory Visit: Payer: Self-pay

## 2022-09-09 ENCOUNTER — Other Ambulatory Visit (HOSPITAL_COMMUNITY): Payer: Self-pay

## 2022-09-09 DIAGNOSIS — M79672 Pain in left foot: Secondary | ICD-10-CM | POA: Diagnosis not present

## 2022-09-09 DIAGNOSIS — M67872 Other specified disorders of synovium, left ankle and foot: Secondary | ICD-10-CM | POA: Diagnosis not present

## 2022-09-09 DIAGNOSIS — M21622 Bunionette of left foot: Secondary | ICD-10-CM | POA: Diagnosis not present

## 2022-09-09 DIAGNOSIS — M67871 Other specified disorders of synovium, right ankle and foot: Secondary | ICD-10-CM | POA: Diagnosis not present

## 2022-09-09 MED ORDER — DICLOFENAC SODIUM 1 % EX GEL
CUTANEOUS | 0 refills | Status: AC
  Filled 2022-09-09: qty 100, 16d supply, fill #0
  Filled 2022-09-09: qty 100, 17d supply, fill #0

## 2022-09-16 ENCOUNTER — Other Ambulatory Visit (HOSPITAL_COMMUNITY): Payer: Self-pay

## 2022-09-17 ENCOUNTER — Other Ambulatory Visit (HOSPITAL_COMMUNITY): Payer: Self-pay

## 2022-09-23 ENCOUNTER — Other Ambulatory Visit (HOSPITAL_COMMUNITY): Payer: Self-pay

## 2022-09-30 DIAGNOSIS — M25571 Pain in right ankle and joints of right foot: Secondary | ICD-10-CM | POA: Diagnosis not present

## 2022-10-03 DIAGNOSIS — M67871 Other specified disorders of synovium, right ankle and foot: Secondary | ICD-10-CM | POA: Diagnosis not present

## 2022-10-03 DIAGNOSIS — M67872 Other specified disorders of synovium, left ankle and foot: Secondary | ICD-10-CM | POA: Diagnosis not present

## 2022-10-07 ENCOUNTER — Other Ambulatory Visit (HOSPITAL_COMMUNITY): Payer: Self-pay

## 2022-10-31 ENCOUNTER — Other Ambulatory Visit (HOSPITAL_COMMUNITY): Payer: Self-pay

## 2022-11-24 ENCOUNTER — Other Ambulatory Visit (HOSPITAL_COMMUNITY): Payer: Self-pay

## 2022-11-24 MED ORDER — VALACYCLOVIR HCL 1 G PO TABS
1000.0000 mg | ORAL_TABLET | Freq: Every day | ORAL | 3 refills | Status: DC
  Filled 2022-11-24: qty 30, 30d supply, fill #0
  Filled 2022-12-27: qty 30, 30d supply, fill #1
  Filled 2023-02-07: qty 30, 30d supply, fill #2
  Filled 2023-04-13: qty 30, 30d supply, fill #3
  Filled 2023-05-16: qty 30, 30d supply, fill #4
  Filled 2023-06-10 – 2023-06-12 (×2): qty 30, 30d supply, fill #5
  Filled 2023-07-10: qty 30, 30d supply, fill #6
  Filled 2023-09-17: qty 30, 30d supply, fill #7
  Filled 2023-10-18: qty 30, 30d supply, fill #8

## 2022-11-25 ENCOUNTER — Other Ambulatory Visit (HOSPITAL_COMMUNITY): Payer: Self-pay

## 2022-11-25 DIAGNOSIS — Z0000 Encounter for general adult medical examination without abnormal findings: Secondary | ICD-10-CM | POA: Diagnosis not present

## 2022-11-25 DIAGNOSIS — E782 Mixed hyperlipidemia: Secondary | ICD-10-CM | POA: Diagnosis not present

## 2022-11-25 DIAGNOSIS — E559 Vitamin D deficiency, unspecified: Secondary | ICD-10-CM | POA: Diagnosis not present

## 2022-11-25 MED ORDER — BETAMETHASONE DIPROPIONATE 0.05 % EX CREA
1.0000 | TOPICAL_CREAM | Freq: Two times a day (BID) | CUTANEOUS | 3 refills | Status: AC | PRN
  Filled 2022-11-25 (×3): qty 45, 30d supply, fill #0
  Filled 2022-12-27: qty 45, 30d supply, fill #1
  Filled 2023-06-10 – 2023-06-12 (×2): qty 45, 30d supply, fill #2

## 2022-11-25 MED ORDER — OMEPRAZOLE 20 MG PO CPDR
20.0000 mg | DELAYED_RELEASE_CAPSULE | Freq: Every morning | ORAL | 3 refills | Status: AC
  Filled 2022-11-25 (×2): qty 30, 30d supply, fill #0

## 2022-12-27 ENCOUNTER — Other Ambulatory Visit (HOSPITAL_COMMUNITY): Payer: Self-pay

## 2022-12-27 ENCOUNTER — Other Ambulatory Visit: Payer: Self-pay

## 2023-02-07 ENCOUNTER — Other Ambulatory Visit (HOSPITAL_COMMUNITY): Payer: Self-pay

## 2023-02-08 ENCOUNTER — Encounter (HOSPITAL_COMMUNITY): Payer: Self-pay

## 2023-02-08 ENCOUNTER — Other Ambulatory Visit (HOSPITAL_COMMUNITY): Payer: Self-pay

## 2023-02-10 ENCOUNTER — Other Ambulatory Visit (HOSPITAL_COMMUNITY): Payer: Self-pay

## 2023-02-13 ENCOUNTER — Other Ambulatory Visit (HOSPITAL_COMMUNITY): Payer: Self-pay

## 2023-03-22 ENCOUNTER — Other Ambulatory Visit (HOSPITAL_COMMUNITY): Payer: Self-pay

## 2023-04-13 ENCOUNTER — Other Ambulatory Visit (HOSPITAL_COMMUNITY): Payer: Self-pay

## 2023-04-15 ENCOUNTER — Other Ambulatory Visit (HOSPITAL_COMMUNITY): Payer: Self-pay

## 2023-05-16 ENCOUNTER — Other Ambulatory Visit (HOSPITAL_COMMUNITY): Payer: Self-pay

## 2023-05-16 ENCOUNTER — Other Ambulatory Visit: Payer: Self-pay

## 2023-05-17 ENCOUNTER — Other Ambulatory Visit (HOSPITAL_COMMUNITY): Payer: Self-pay

## 2023-05-29 ENCOUNTER — Other Ambulatory Visit (HOSPITAL_COMMUNITY): Payer: Self-pay

## 2023-06-01 ENCOUNTER — Other Ambulatory Visit (HOSPITAL_COMMUNITY): Payer: Self-pay

## 2023-06-01 MED ORDER — PREMARIN 0.625 MG PO TABS
0.6250 mg | ORAL_TABLET | Freq: Every day | ORAL | 11 refills | Status: AC
  Filled 2023-06-01 – 2023-06-12 (×3): qty 30, 30d supply, fill #0
  Filled 2023-07-10: qty 30, 30d supply, fill #1
  Filled 2023-08-16: qty 30, 30d supply, fill #2
  Filled 2023-09-26: qty 30, 30d supply, fill #3

## 2023-06-02 ENCOUNTER — Encounter: Payer: Self-pay | Admitting: Pharmacist

## 2023-06-02 ENCOUNTER — Other Ambulatory Visit: Payer: Self-pay

## 2023-06-10 ENCOUNTER — Other Ambulatory Visit (HOSPITAL_COMMUNITY): Payer: Self-pay

## 2023-06-12 ENCOUNTER — Other Ambulatory Visit (HOSPITAL_COMMUNITY): Payer: Self-pay

## 2023-06-12 ENCOUNTER — Other Ambulatory Visit: Payer: Self-pay

## 2023-07-04 DIAGNOSIS — Z1231 Encounter for screening mammogram for malignant neoplasm of breast: Secondary | ICD-10-CM | POA: Diagnosis not present

## 2023-07-10 ENCOUNTER — Other Ambulatory Visit: Payer: Self-pay

## 2023-07-10 ENCOUNTER — Other Ambulatory Visit (HOSPITAL_COMMUNITY): Payer: Self-pay

## 2023-07-11 ENCOUNTER — Other Ambulatory Visit (HOSPITAL_COMMUNITY): Payer: Self-pay

## 2023-07-11 DIAGNOSIS — N6321 Unspecified lump in the left breast, upper outer quadrant: Secondary | ICD-10-CM | POA: Diagnosis not present

## 2023-07-11 DIAGNOSIS — D360 Benign neoplasm of lymph nodes: Secondary | ICD-10-CM | POA: Diagnosis not present

## 2023-07-11 DIAGNOSIS — R922 Inconclusive mammogram: Secondary | ICD-10-CM | POA: Diagnosis not present

## 2023-07-12 ENCOUNTER — Other Ambulatory Visit: Payer: Self-pay

## 2023-07-12 ENCOUNTER — Other Ambulatory Visit (HOSPITAL_COMMUNITY): Payer: Self-pay

## 2023-07-12 DIAGNOSIS — L282 Other prurigo: Secondary | ICD-10-CM | POA: Diagnosis not present

## 2023-07-12 DIAGNOSIS — T753XXA Motion sickness, initial encounter: Secondary | ICD-10-CM | POA: Diagnosis not present

## 2023-07-12 MED ORDER — SCOPOLAMINE 1 MG/3DAYS TD PT72
MEDICATED_PATCH | TRANSDERMAL | 0 refills | Status: AC | PRN
  Filled 2023-07-12: qty 9, 27d supply, fill #0

## 2023-07-12 MED ORDER — PREDNISONE 20 MG PO TABS
40.0000 mg | ORAL_TABLET | Freq: Every day | ORAL | 0 refills | Status: AC
  Filled 2023-07-12: qty 14, 7d supply, fill #0

## 2023-08-02 DIAGNOSIS — Z01419 Encounter for gynecological examination (general) (routine) without abnormal findings: Secondary | ICD-10-CM | POA: Diagnosis not present

## 2023-08-16 ENCOUNTER — Other Ambulatory Visit (HOSPITAL_COMMUNITY): Payer: Self-pay

## 2023-09-18 ENCOUNTER — Other Ambulatory Visit: Payer: Self-pay

## 2023-09-18 ENCOUNTER — Other Ambulatory Visit (HOSPITAL_COMMUNITY): Payer: Self-pay

## 2023-09-26 ENCOUNTER — Other Ambulatory Visit (HOSPITAL_COMMUNITY): Payer: Self-pay

## 2023-09-26 ENCOUNTER — Other Ambulatory Visit: Payer: Self-pay

## 2023-10-18 ENCOUNTER — Other Ambulatory Visit (HOSPITAL_COMMUNITY): Payer: Self-pay

## 2023-11-07 DIAGNOSIS — E559 Vitamin D deficiency, unspecified: Secondary | ICD-10-CM | POA: Diagnosis not present

## 2023-11-07 DIAGNOSIS — M7661 Achilles tendinitis, right leg: Secondary | ICD-10-CM | POA: Diagnosis not present

## 2023-11-07 DIAGNOSIS — Z01818 Encounter for other preprocedural examination: Secondary | ICD-10-CM | POA: Diagnosis not present

## 2023-11-07 DIAGNOSIS — E782 Mixed hyperlipidemia: Secondary | ICD-10-CM | POA: Diagnosis not present

## 2023-11-30 ENCOUNTER — Other Ambulatory Visit (HOSPITAL_COMMUNITY): Payer: Self-pay

## 2023-12-01 ENCOUNTER — Other Ambulatory Visit (HOSPITAL_COMMUNITY): Payer: Self-pay

## 2023-12-01 MED ORDER — VALACYCLOVIR HCL 1 G PO TABS
1.0000 g | ORAL_TABLET | Freq: Every day | ORAL | 3 refills | Status: AC
  Filled 2023-12-01: qty 30, 30d supply, fill #0
  Filled 2024-01-03: qty 30, 30d supply, fill #1
  Filled 2024-02-16: qty 30, 30d supply, fill #2
  Filled 2024-03-17 – 2024-03-21 (×2): qty 30, 30d supply, fill #3
  Filled 2024-04-16 – 2024-04-17 (×2): qty 30, 30d supply, fill #4
  Filled 2024-05-15: qty 30, 30d supply, fill #5

## 2023-12-04 DIAGNOSIS — R7309 Other abnormal glucose: Secondary | ICD-10-CM | POA: Diagnosis not present

## 2023-12-04 DIAGNOSIS — E782 Mixed hyperlipidemia: Secondary | ICD-10-CM | POA: Diagnosis not present

## 2023-12-04 DIAGNOSIS — Z0000 Encounter for general adult medical examination without abnormal findings: Secondary | ICD-10-CM | POA: Diagnosis not present

## 2024-01-03 ENCOUNTER — Other Ambulatory Visit (HOSPITAL_COMMUNITY): Payer: Self-pay

## 2024-02-16 ENCOUNTER — Other Ambulatory Visit (HOSPITAL_COMMUNITY): Payer: Self-pay

## 2024-03-17 ENCOUNTER — Other Ambulatory Visit (HOSPITAL_COMMUNITY): Payer: Self-pay

## 2024-03-21 ENCOUNTER — Other Ambulatory Visit (HOSPITAL_COMMUNITY): Payer: Self-pay

## 2024-04-17 ENCOUNTER — Other Ambulatory Visit (HOSPITAL_COMMUNITY): Payer: Self-pay

## 2024-04-17 ENCOUNTER — Other Ambulatory Visit: Payer: Self-pay

## 2024-04-23 ENCOUNTER — Other Ambulatory Visit (HOSPITAL_COMMUNITY): Payer: Self-pay

## 2024-05-16 ENCOUNTER — Other Ambulatory Visit (HOSPITAL_COMMUNITY): Payer: Self-pay
